# Patient Record
Sex: Female | Born: 1965 | Race: White | Hispanic: No | Marital: Married | Smoking: Former smoker
Health system: Southern US, Community
[De-identification: ages and names within clinical notes are randomized; demographics above are authoritative.]

## PROBLEM LIST (undated history)

## (undated) DIAGNOSIS — F329 Major depressive disorder, single episode, unspecified: Secondary | ICD-10-CM

## (undated) DIAGNOSIS — E119 Type 2 diabetes mellitus without complications: Secondary | ICD-10-CM

## (undated) DIAGNOSIS — C50319 Malignant neoplasm of lower-inner quadrant of unspecified female breast: Secondary | ICD-10-CM

## (undated) DIAGNOSIS — F32A Depression, unspecified: Secondary | ICD-10-CM

## (undated) DIAGNOSIS — G2581 Restless legs syndrome: Secondary | ICD-10-CM

## (undated) DIAGNOSIS — E669 Obesity, unspecified: Secondary | ICD-10-CM

## (undated) DIAGNOSIS — I10 Essential (primary) hypertension: Secondary | ICD-10-CM

## (undated) DIAGNOSIS — C50919 Malignant neoplasm of unspecified site of unspecified female breast: Secondary | ICD-10-CM

## (undated) DIAGNOSIS — C801 Malignant (primary) neoplasm, unspecified: Secondary | ICD-10-CM

## (undated) DIAGNOSIS — N926 Irregular menstruation, unspecified: Secondary | ICD-10-CM

## (undated) DIAGNOSIS — Z87891 Personal history of nicotine dependence: Secondary | ICD-10-CM

## (undated) DIAGNOSIS — N649 Disorder of breast, unspecified: Secondary | ICD-10-CM

## (undated) DIAGNOSIS — N951 Menopausal and female climacteric states: Secondary | ICD-10-CM

## (undated) DIAGNOSIS — B009 Herpesviral infection, unspecified: Secondary | ICD-10-CM

## (undated) DIAGNOSIS — Z1379 Encounter for other screening for genetic and chromosomal anomalies: Secondary | ICD-10-CM

## (undated) DIAGNOSIS — E785 Hyperlipidemia, unspecified: Secondary | ICD-10-CM

## (undated) HISTORY — DX: Personal history of nicotine dependence: Z87.891

## (undated) HISTORY — DX: Major depressive disorder, single episode, unspecified: F32.9

## (undated) HISTORY — DX: Encounter for other screening for genetic and chromosomal anomalies: Z13.79

## (undated) HISTORY — DX: Irregular menstruation, unspecified: N92.6

## (undated) HISTORY — DX: Obesity, unspecified: E66.9

## (undated) HISTORY — DX: Herpesviral infection, unspecified: B00.9

## (undated) HISTORY — DX: Essential (primary) hypertension: I10

## (undated) HISTORY — DX: Hyperlipidemia, unspecified: E78.5

## (undated) HISTORY — PX: FOOT SURGERY: SHX648

## (undated) HISTORY — DX: Depression, unspecified: F32.A

## (undated) HISTORY — DX: Disorder of breast, unspecified: N64.9

## (undated) HISTORY — DX: Menopausal and female climacteric states: N95.1

## (undated) HISTORY — PX: ENDOMETRIAL ABLATION: SHX621

## (undated) HISTORY — DX: Malignant neoplasm of lower-inner quadrant of unspecified female breast: C50.319

## (undated) HISTORY — DX: Malignant (primary) neoplasm, unspecified: C80.1

---

## 1990-05-30 DIAGNOSIS — C801 Malignant (primary) neoplasm, unspecified: Secondary | ICD-10-CM

## 1990-05-30 HISTORY — DX: Malignant (primary) neoplasm, unspecified: C80.1

## 2001-05-30 HISTORY — PX: BREAST BIOPSY: SHX20

## 2004-05-30 HISTORY — PX: TONSILLECTOMY: SUR1361

## 2004-06-10 ENCOUNTER — Ambulatory Visit: Payer: Self-pay | Admitting: Otolaryngology

## 2004-07-07 ENCOUNTER — Ambulatory Visit: Payer: Self-pay | Admitting: Otolaryngology

## 2004-07-09 ENCOUNTER — Observation Stay: Payer: Self-pay | Admitting: Otolaryngology

## 2007-05-31 DIAGNOSIS — C50919 Malignant neoplasm of unspecified site of unspecified female breast: Secondary | ICD-10-CM

## 2007-05-31 DIAGNOSIS — I10 Essential (primary) hypertension: Secondary | ICD-10-CM

## 2007-05-31 HISTORY — DX: Malignant neoplasm of unspecified site of unspecified female breast: C50.919

## 2007-05-31 HISTORY — DX: Essential (primary) hypertension: I10

## 2007-05-31 HISTORY — PX: BREAST LUMPECTOMY: SHX2

## 2007-05-31 HISTORY — PX: BREAST SURGERY: SHX581

## 2007-08-10 ENCOUNTER — Other Ambulatory Visit: Payer: Self-pay

## 2007-08-10 ENCOUNTER — Ambulatory Visit: Payer: Self-pay | Admitting: General Surgery

## 2007-08-14 ENCOUNTER — Ambulatory Visit: Payer: Self-pay | Admitting: General Surgery

## 2007-08-29 ENCOUNTER — Ambulatory Visit: Payer: Self-pay | Admitting: Radiation Oncology

## 2007-09-28 ENCOUNTER — Ambulatory Visit: Payer: Self-pay | Admitting: Radiation Oncology

## 2007-10-29 ENCOUNTER — Ambulatory Visit: Payer: Self-pay | Admitting: Radiation Oncology

## 2007-11-28 ENCOUNTER — Ambulatory Visit: Payer: Self-pay | Admitting: Radiation Oncology

## 2007-12-29 ENCOUNTER — Ambulatory Visit: Payer: Self-pay | Admitting: Radiation Oncology

## 2008-01-29 ENCOUNTER — Ambulatory Visit: Payer: Self-pay | Admitting: Radiation Oncology

## 2008-02-06 ENCOUNTER — Ambulatory Visit: Payer: Self-pay | Admitting: General Surgery

## 2008-08-01 ENCOUNTER — Ambulatory Visit: Payer: Self-pay | Admitting: General Surgery

## 2009-03-03 ENCOUNTER — Ambulatory Visit: Payer: Self-pay | Admitting: General Surgery

## 2009-05-30 ENCOUNTER — Ambulatory Visit: Payer: Self-pay | Admitting: Oncology

## 2009-07-01 ENCOUNTER — Ambulatory Visit: Payer: Self-pay | Admitting: Oncology

## 2009-07-28 ENCOUNTER — Ambulatory Visit: Payer: Self-pay | Admitting: Oncology

## 2009-08-05 ENCOUNTER — Ambulatory Visit: Payer: Self-pay

## 2009-08-10 ENCOUNTER — Ambulatory Visit: Payer: Self-pay | Admitting: Oncology

## 2009-08-28 ENCOUNTER — Ambulatory Visit: Payer: Self-pay | Admitting: Oncology

## 2010-03-10 ENCOUNTER — Ambulatory Visit: Payer: Self-pay | Admitting: General Surgery

## 2011-03-15 ENCOUNTER — Ambulatory Visit: Payer: Self-pay | Admitting: General Surgery

## 2011-05-31 DIAGNOSIS — E669 Obesity, unspecified: Secondary | ICD-10-CM

## 2011-05-31 HISTORY — DX: Obesity, unspecified: E66.9

## 2012-03-15 ENCOUNTER — Ambulatory Visit: Payer: Self-pay | Admitting: General Surgery

## 2012-10-26 ENCOUNTER — Encounter: Payer: Self-pay | Admitting: *Deleted

## 2012-10-26 DIAGNOSIS — C801 Malignant (primary) neoplasm, unspecified: Secondary | ICD-10-CM | POA: Insufficient documentation

## 2013-03-18 ENCOUNTER — Encounter: Payer: Self-pay | Admitting: General Surgery

## 2013-03-18 ENCOUNTER — Ambulatory Visit: Payer: Self-pay | Admitting: General Surgery

## 2013-03-26 ENCOUNTER — Ambulatory Visit: Payer: Self-pay | Admitting: General Surgery

## 2013-04-15 ENCOUNTER — Ambulatory Visit (INDEPENDENT_AMBULATORY_CARE_PROVIDER_SITE_OTHER): Payer: 59 | Admitting: General Surgery

## 2013-04-15 ENCOUNTER — Encounter: Payer: Self-pay | Admitting: General Surgery

## 2013-04-15 VITALS — BP 130/72 | HR 76 | Resp 14 | Ht 64.0 in | Wt 258.0 lb

## 2013-04-15 DIAGNOSIS — Z853 Personal history of malignant neoplasm of breast: Secondary | ICD-10-CM | POA: Insufficient documentation

## 2013-04-15 DIAGNOSIS — Z1239 Encounter for other screening for malignant neoplasm of breast: Secondary | ICD-10-CM | POA: Insufficient documentation

## 2013-04-15 NOTE — Patient Instructions (Signed)
Patient to return in 1 year with bilateral diagnostic mammogram. Patient to contact our office with any new questions or concerns.

## 2013-04-15 NOTE — Progress Notes (Signed)
Patient ID: Deborah Barajas, female   DOB: 26-Jun-1965, 47 y.o.   MRN: [id number]  Chief Complaint  Patient presents with  . Follow-up    1 year bilateral diagnostic mammogram     HPI Deborah Barajas is a 47 y.o. female who presents for a breast evaluation. The most recent mammogram was done on 03/18/13. Patient does perform regular self breast checks and gets regular mammograms done.The patient denies any new problems with the breasts at this time.   The patient reports that genetic testing was not covered by her insurance company.  HPI  Past Medical History  Diagnosis Date  . Herpes simplex without mention of complication 2012  . Hypertension 2009  . Personal history of tobacco use, presenting hazards to health   . Sleep apnea 2006  . Cancer 2009    left breast, diagnosed in March of 2009. 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 This was histolic grade 1, sn was negative.Hormone receptor status shows strong ER/PR. HER-2/neu:1+ Pt has completed radiation therapy in 2009  . 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 Cancer 1992  . Hyperlipidemia   . Obesity, unspecified 2013  . Depression     Past Surgical History  Procedure Laterality Date  . Breast surgery Left 2009    wide excision with sn biopsy  . Breast biopsy  2003  . Tonsillectomy  2006  . Cesarean section    . Foot surgery      Family History  Problem Relation Age of Onset  . Cancer Other     breast cancer  . Cancer Paternal Aunt     breast     Social History History  Substance Use Topics  . Smoking status: Former Smoker -- 10 years    Types: Cigarettes  . Smokeless tobacco: Not on file     Comment: quit in 1994  . Alcohol Use: No    Allergies  Allergen Reactions  . Penicillins Itching    Current Outpatient Prescriptions  Medication Sig Dispense Refill  . atorvastatin (LIPITOR) 40 MG tablet Take 1 tablet by mouth daily.      .  citalopram (CELEXA) 20 MG tablet Take 1 tablet by mouth daily.      . hydrochlorothiazide (HYDRODIURIL) 25 MG tablet Take 1 tablet by mouth daily.      Marland Kitchen lisinopril (PRINIVIL,ZESTRIL) 20 MG tablet Take 1 tablet by mouth daily.      Marland Kitchen venlafaxine XR (EFFEXOR-XR) 37.5 MG 24 hr capsule Take 1 capsule by mouth daily.       No current facility-administered medications for this visit.    Review of Systems Review of Systems  Constitutional: Negative.   Respiratory: Negative.   Cardiovascular: Negative.     Blood pressure 130/72, pulse 76, resp. rate 14, height 5\' 4"  (1.626 m), weight 258 lb (117.028 kg), last menstrual period 03/16/2013.  Physical Exam Physical Exam  Nursing note reviewed. Constitutional: She is oriented to person, place, and time. She appears well-developed and well-nourished.  Neck: Neck supple. No thyromegaly present.  Cardiovascular: Normal rate, regular rhythm and normal heart sounds.   No murmur heard. Pulmonary/Chest: Effort normal and breath sounds normal. Right breast exhibits no inverted nipple, no mass, no nipple discharge, no skin change and no tenderness. Left breast exhibits no inverted nipple, no mass, no nipple discharge, no skin change and no tenderness.  Thickening at the wide excision site of left breast.  Well healed  scar in the left axillary.   Lymphadenopathy:    She has no cervical adenopathy.    She has no axillary adenopathy.  Neurological: She is alert and oriented to person, place, and time.  Skin: Skin is warm and dry.    Data Reviewed Bilateral mammograms dated January 16, 2013 were reviewed. No interval change.  BI-RAD-2.  Assessment    Benign breast exam. Now 5.5 years after conservative treatment.     Plan    The patient elected not to complete 5 years of antiestrogen therapy.  Gen. Low-grade tumor, T1b, no negative.  A prescription for a left breast insert and surgical bra was provided due to the asymmetry present.  We'll  plan for follow up examination with repeat bilateral diagnostic mammograms in one year.        Deborah Barajas 04/15/2013, 8:50 PM

## 2013-04-18 ENCOUNTER — Telehealth: Payer: Self-pay | Admitting: General Surgery

## 2013-04-18 NOTE — Telephone Encounter (Signed)
04-18-13 @ 12:48PM I CALLED PT.NEED PCP INFO/[facility]

## 2013-05-07 ENCOUNTER — Encounter: Payer: Self-pay | Admitting: General Surgery

## 2014-03-05 ENCOUNTER — Other Ambulatory Visit: Payer: 59 | Admitting: Advanced Practice Midwife

## 2014-03-07 ENCOUNTER — Other Ambulatory Visit: Payer: 59 | Admitting: Obstetrics & Gynecology

## 2014-03-12 ENCOUNTER — Other Ambulatory Visit (HOSPITAL_COMMUNITY)
Admission: RE | Admit: 2014-03-12 | Discharge: 2014-03-12 | Disposition: A | Discharge status: Home / Self Care | Payer: 59 | Source: Ambulatory Visit | Attending: Adult Health | Admitting: Adult Health

## 2014-03-12 ENCOUNTER — Encounter: Payer: Self-pay | Admitting: Adult Health

## 2014-03-12 ENCOUNTER — Ambulatory Visit (INDEPENDENT_AMBULATORY_CARE_PROVIDER_SITE_OTHER): Payer: 59 | Admitting: Adult Health

## 2014-03-12 ENCOUNTER — Other Ambulatory Visit: Payer: Self-pay | Admitting: Adult Health

## 2014-03-12 VITALS — BP 132/72 | HR 80 | Ht 64.0 in | Wt 262.5 lb

## 2014-03-12 DIAGNOSIS — Z853 Personal history of malignant neoplasm of breast: Secondary | ICD-10-CM

## 2014-03-12 DIAGNOSIS — Z01419 Encounter for gynecological examination (general) (routine) without abnormal findings: Secondary | ICD-10-CM

## 2014-03-12 DIAGNOSIS — I10 Essential (primary) hypertension: Secondary | ICD-10-CM

## 2014-03-12 DIAGNOSIS — N926 Irregular menstruation, unspecified: Secondary | ICD-10-CM

## 2014-03-12 DIAGNOSIS — Z1212 Encounter for screening for malignant neoplasm of rectum: Secondary | ICD-10-CM

## 2014-03-12 DIAGNOSIS — Z1151 Encounter for screening for human papillomavirus (HPV): Secondary | ICD-10-CM | POA: Diagnosis present

## 2014-03-12 DIAGNOSIS — N951 Menopausal and female climacteric states: Secondary | ICD-10-CM | POA: Insufficient documentation

## 2014-03-12 HISTORY — DX: Irregular menstruation, unspecified: N92.6

## 2014-03-12 HISTORY — DX: Menopausal and female climacteric states: N95.1

## 2014-03-12 LAB — LIPID PANEL
CHOL/HDL RATIO: 6.4 ratio
Cholesterol: 249 mg/dL — ABNORMAL HIGH (ref 0–200)
HDL: 39 mg/dL — AB (ref >39)
LDL CALC: 154 mg/dL — AB (ref 0–99)
TRIGLYCERIDES: 278 mg/dL — AB (ref <150)
VLDL: 56 mg/dL — AB (ref 0–40)

## 2014-03-12 LAB — COMPREHENSIVE METABOLIC PANEL
ALK PHOS: 49 U/L (ref 39–117)
ALT: 14 U/L (ref 0–35)
AST: 16 U/L (ref 0–37)
Albumin: 3.6 g/dL (ref 3.5–5.2)
BUN: 10 mg/dL (ref 6–23)
CALCIUM: 8.6 mg/dL (ref 8.4–10.5)
CHLORIDE: 107 meq/L (ref 96–112)
CO2: 25 mEq/L (ref 19–32)
CREATININE: 0.65 mg/dL (ref 0.50–1.10)
Glucose, Bld: 120 mg/dL — ABNORMAL HIGH (ref 70–99)
Potassium: 4.2 mEq/L (ref 3.5–5.3)
Sodium: 141 mEq/L (ref 135–145)
Total Bilirubin: 0.4 mg/dL (ref 0.2–1.2)
Total Protein: 6 g/dL (ref 6.0–8.3)

## 2014-03-12 LAB — CBC
HEMATOCRIT: 37 % (ref 36.0–46.0)
Hemoglobin: 12.3 g/dL (ref 12.0–15.0)
MCH: 30.8 pg (ref 26.0–34.0)
MCHC: 33.2 g/dL (ref 30.0–36.0)
MCV: 92.5 fL (ref 78.0–100.0)
PLATELETS: 326 10*3/uL (ref 150–400)
RBC: 4 MIL/uL (ref 3.87–5.11)
RDW: 13.6 % (ref 11.5–15.5)
WBC: 7 10*3/uL (ref 4.0–10.5)

## 2014-03-12 LAB — HEMOCCULT GUIAC POC 1CARD (OFFICE): Fecal Occult Blood, POC: NEGATIVE

## 2014-03-12 LAB — TSH: TSH: 1.973 u[IU]/mL (ref 0.350–4.500)

## 2014-03-12 MED ORDER — NYSTATIN 100000 UNIT/GM EX POWD: CUTANEOUS | Status: DC

## 2014-03-12 NOTE — Progress Notes (Signed)
Patient ID: Deborah Barajas, female   DOB: 04-26-1966, 48 y.o.   MRN: [id number] History of Present Illness: Deborah Barajas is a 48 year old white female,married, new to this practice,in for a pap and physical.She complains of irregular periods with clots, some hot feelings and vaginal itch.She is sp breast cancer that was ER+.   Current Medications, Allergies, Past Medical History, Past Surgical History, Family History and Social History were reviewed in [employer] record.     Review of Systems: Patient denies any  blurred vision, shortness of breath, chest pain, abdominal pain, problems with bowel movements, urination, or intercourse. No joint pain or swelling, is stiff after sitting.She is moody but takes celexa and Effexor.She has what sounds like menstrual headaches,see HPI.    Physical Exam:BP 132/72  Pulse 80  Ht 5\' 4"  (1.626 m)  Wt 262 lb 8 oz (119.069 kg)  BMI 45.04 kg/m2  LMP 03/03/2014 General:  Well developed, well nourished, no acute distress Skin:  Warm and dry Neck:  Midline trachea, normal thyroid Lungs; Clear to auscultation bilaterally Breast:  No dominant palpable mass, retraction, or nipple discharge, has scarring left breast where had lumpectomy and radiation at 9-10 o'clock it is thick and firm Cardiovascular: Regular rate and rhythm Abdomen:  Soft, non tender, no hepatosplenomegaly,obese Pelvic:  External genitalia is normal in appearance.Skin fungus in groin.  The vagina is normal in appearance.  The cervix is bulbous. Pap with HPV performed. Uterus is felt to be normal size, shape, and contour.  No   adnexal masses or tenderness noted. Rectal: Good sphincter tone, no polyps, or hemorrhoids felt.  Hemoccult negative. Extremities:  No swelling or varicosities noted Psych:  No mood changes,alert and cooperative,seems happy   Impression: Well woman gyn exam Irregular periods Peri menopausal Hypertension History of breast cancer Skin  fungus     Plan: Rx nystatin powder to use 2-3 x daily prn Return in 2 weeks for [place] and see me Request records from [place]  Physical in 1 year  Mammogram in November Colonoscopy at 77  Check CBC,CMP,TSH and lipids  Review handout on perimenopause

## 2014-03-12 NOTE — Patient Instructions (Signed)
Return in 2 weeks for [place] and see me  Get mammogram yearly  Colonoscopy at 70 Perimenopause Perimenopause is the time when your body begins to move into the menopause (no menstrual period for 12 straight months). It is a natural process. Perimenopause can begin 2-8 years before the menopause and usually lasts for 1 year after the menopause. During this time, your ovaries may or may not produce an egg. The ovaries vary in their production of estrogen and progesterone hormones each month. This can cause irregular menstrual periods, difficulty getting pregnant, vaginal bleeding between periods, and uncomfortable symptoms. CAUSES  Irregular production of the ovarian hormones, estrogen and progesterone, and not ovulating every month.  Other causes include:  Tumor of the pituitary gland in the brain.  Medical disease that affects the ovaries.  Radiation treatment.  Chemotherapy.  Unknown causes.  Heavy smoking and excessive alcohol intake can bring on perimenopause sooner. SIGNS AND SYMPTOMS   Hot flashes.  Night sweats.  Irregular menstrual periods.  Decreased sex drive.  Vaginal dryness.  Headaches.  Mood swings.  Depression.  Memory problems.  Irritability.  Tiredness.  Weight gain.  Trouble getting pregnant.  The beginning of losing bone cells (osteoporosis).  The beginning of hardening of the arteries (atherosclerosis). DIAGNOSIS  Your health care provider will make a diagnosis by analyzing your age, menstrual history, and symptoms. He or she will do a physical exam and note any changes in your body, especially your female organs. Female hormone tests may or may not be helpful depending on the amount of female hormones you produce and when you produce them. However, other hormone tests may be helpful to rule out other problems. TREATMENT  In some cases, no treatment is needed. The decision on whether treatment is necessary during the perimenopause should be made  by you and your health care provider based on how the symptoms are affecting you and your lifestyle. Various treatments are available, such as:  Treating individual symptoms with a specific medicine for that symptom.  Herbal medicines that can help specific symptoms.  Counseling.  Group therapy. HOME CARE INSTRUCTIONS   Keep track of your menstrual periods (when they occur, how heavy they are, how long between periods, and how long they last) as well as your symptoms and when they started.  Only take over-the-counter or prescription medicines as directed by your health care provider.  Sleep and rest.  Exercise.  Eat a diet that contains calcium (good for your bones) and soy (acts like the estrogen hormone).  Do not smoke.  Avoid alcoholic beverages.  Take vitamin supplements as recommended by your health care provider. Taking vitamin E may help in certain cases.  Take calcium and vitamin D supplements to help prevent bone loss.  Group therapy is sometimes helpful.  Acupuncture may help in some cases. SEEK MEDICAL CARE IF:   You have questions about any symptoms you are having.  You need a referral to a specialist (gynecologist, psychiatrist, or psychologist). SEEK IMMEDIATE MEDICAL CARE IF:   You have vaginal bleeding.  Your period lasts longer than 8 days.  Your periods are recurring sooner than 21 days.  You have bleeding after intercourse.  You have severe depression.  You have pain when you urinate.  You have severe headaches.  You have vision problems. Document Released: 06/23/2004 Document Revised: 03/06/2013 Document Reviewed: 12/13/2012 [facility] Patient Information 2015 [place], [place]. This information is not intended to replace advice given to you by your health care provider. Make  sure you discuss any questions you have with your health care provider.  

## 2014-03-13 LAB — HEMOGLOBIN A1C
HEMOGLOBIN A1C: 6.2 % — AB (ref <5.7)
Mean Plasma Glucose: 131 mg/dL — ABNORMAL HIGH (ref <117)

## 2014-03-13 LAB — CYTOLOGY - PAP

## 2014-03-14 ENCOUNTER — Telehealth: Payer: Self-pay | Admitting: Adult Health

## 2014-03-14 NOTE — Telephone Encounter (Signed)
Eft message to call about labs monday

## 2014-03-17 ENCOUNTER — Telehealth: Payer: Self-pay | Admitting: Adult Health

## 2014-03-17 NOTE — Telephone Encounter (Signed)
Pt aware A1c 6.2 at risk of developing diabetes,try diet modifications(decrease carbs) and increase exercise, may consider metformin and needs to get back on lipitor cholesterol and triglycerides high, will send copy to [facility] and they can order labs in 3 months and manage

## 2014-03-26 ENCOUNTER — Ambulatory Visit (INDEPENDENT_AMBULATORY_CARE_PROVIDER_SITE_OTHER): Payer: 59 | Admitting: Adult Health

## 2014-03-26 ENCOUNTER — Encounter: Payer: Self-pay | Admitting: Adult Health

## 2014-03-26 ENCOUNTER — Ambulatory Visit (INDEPENDENT_AMBULATORY_CARE_PROVIDER_SITE_OTHER): Payer: 59

## 2014-03-26 ENCOUNTER — Other Ambulatory Visit: Payer: Self-pay | Admitting: Adult Health

## 2014-03-26 VITALS — BP 136/80 | Ht 64.0 in | Wt 261.0 lb

## 2014-03-26 DIAGNOSIS — N926 Irregular menstruation, unspecified: Secondary | ICD-10-CM

## 2014-03-26 DIAGNOSIS — Z853 Personal history of malignant neoplasm of breast: Secondary | ICD-10-CM

## 2014-03-26 DIAGNOSIS — N951 Menopausal and female climacteric states: Secondary | ICD-10-CM

## 2014-03-26 MED ORDER — IBUPROFEN 800 MG PO TABS: 800.0000 mg | ORAL_TABLET | Freq: Three times a day (TID) | ORAL | Status: DC | PRN

## 2014-03-26 NOTE — Patient Instructions (Signed)
Talk with breast doctor about if progesterone + or not Review handout on ablation and IUD

## 2014-03-26 NOTE — Progress Notes (Signed)
Subjective:     Patient ID: Deborah Barajas, female   DOB: 04/07/1966, 48 y.o.   MRN: [id number]  HPI Rhythm is a 47 year old white female in for [place] for irregular periods with clots at times.  Review of Systems See HPI Reviewed past medical,surgical, social and family history. Reviewed medications and allergies.     Objective:   Physical Exam BP 136/80  Ht 5\' 4"  (1.626 m)  Wt 261 lb (118.389 kg)  BMI 44.78 kg/m2  LMP 03/03/2014,Reviewed [place] with pt.   Uterus 11.5 x 7.6 x 6.0 cm, anteverted  Endometrium 13.9 mm, symmetrical,  Right ovary 2.9 x 2.2 x 1.9 cm,  Left ovary 3.4 x 2.5 x 2.2 cm,  No free fluid or adnexal masses noted  Technician Comments:  Anteverted uterus, Endom-13.77mm symmetrical, bilateral adnexa/ovaries appear WNL, no free fluid or adnexal masses noted within the pelvis, Pt noted increased with pressure applied to LT adnexa  Has headache with this period and tired motrin 800 mg and it helped.disucssed endo ablation or mirena IUD to control periods til PM,but needs to talk with Breast doctor to see if cancer was progesterone +, she knows was ER+ and if he thinks her ovaries need to be removed.She know she can not take HRT.She has appt 11/2 with [facility](they were sent labs).   Assessment:     Irregular periods with clots Peri menopausal History of breast cancer    Plan:     Talk with breast doctor, review handouts on ablation and IUD Follow up prn  Keep appt with [facility]   Rx motrin 800 mg #60 1 every 8 hours prn with 3 refills

## 2014-03-31 ENCOUNTER — Encounter: Payer: Self-pay | Admitting: Adult Health

## 2014-04-08 ENCOUNTER — Encounter: Payer: Self-pay | Admitting: General Surgery

## 2014-04-16 ENCOUNTER — Ambulatory Visit (INDEPENDENT_AMBULATORY_CARE_PROVIDER_SITE_OTHER): Payer: 59 | Admitting: General Surgery

## 2014-04-16 ENCOUNTER — Encounter: Payer: Self-pay | Admitting: General Surgery

## 2014-04-16 VITALS — BP 124/70 | HR 74 | Resp 14 | Ht 64.0 in | Wt 258.0 lb

## 2014-04-16 DIAGNOSIS — Z853 Personal history of malignant neoplasm of breast: Secondary | ICD-10-CM

## 2014-04-16 NOTE — Patient Instructions (Signed)
The patient has been asked to return to the office in one year with a bilateral diagnostic mammogram. 

## 2014-04-16 NOTE — Progress Notes (Signed)
Patient ID: Deborah Barajas, female   DOB: [birth year], 48 y.o.   MRN: [id number]  Chief Complaint  Patient presents with  . Follow-up    mammogram    HPI Deborah Barajas is a 48 y.o. female. who presents for a breast evaluation. The most recent mammogram was done on 04/04/14 .  Patient does perform regular self breast checks and gets regular mammograms done.    HPI  Past Medical History  Diagnosis Date  . Hypertension 2009  . Personal history of tobacco use, presenting hazards to health   . Sleep apnea 2006  . Hyperlipidemia   . Obesity, unspecified 2013  . Depression   . Irregular periods 03/12/2014  . Peri-menopausal 03/12/2014  . Herpes simplex without mention of complication   . Cancer 2009    Left breast, 0.6 cm, diagnosed in March of 2009. s/p lumpectomy with sn biopsy 11m infiltrating ductal carcinoma with widely clear margins. This was histolic grade 1, sn was negative.Hormone receptor status shows strong ER/PR. HER-2/neu: Not amplified on fish. Pt has completed radiation therapy in 2009  . 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 Cancer 1992    Past Surgical History  Procedure Laterality Date  . Breast surgery Left 2009    wide excision with sn biopsy  . Breast biopsy  2003  . Tonsillectomy  2006  . Cesarean section    . Foot surgery      Family History  Problem Relation Age of Onset  . Cancer Other     maternal great aunt-breast  . Cancer Paternal Aunt     breast   . Hypertension Mother   . Thyroid disease Mother   . Hypertension Father   . Diabetes Brother   . Cancer Maternal Aunt     lung  . Cancer Maternal Uncle     leukemia  . Hypertension Maternal Grandmother   . Cancer Maternal Grandfather     lung  . COPD Paternal Grandmother   . Heart attack Paternal Grandfather   . Thyroid disease Brother   . Heart attack Maternal Uncle   . Cancer Maternal Uncle     pancreatic  . Cancer Maternal Aunt     pancreatic  . Diabetes  Paternal Aunt     Social History History  Substance Use Topics  . Smoking status: Former Smoker -- 10 years    Types: Cigarettes  . Smokeless tobacco: Never Used     Comment: quit in 1994  . Alcohol Use: No    Allergies  Allergen Reactions  . Penicillins Itching    Current Outpatient Prescriptions  Medication Sig Dispense Refill  . atorvastatin (LIPITOR) 40 MG tablet Take 1 tablet by mouth daily.    . citalopram (CELEXA) 40 MG tablet Take 40 mg by mouth daily.    . hydrochlorothiazide (HYDRODIURIL) 25 MG tablet Take 1 tablet by mouth daily.    .Marland Kitchenibuprofen (ADVIL,MOTRIN) 800 MG tablet Take 1 tablet (800 mg total) by mouth every 8 (eight) hours as needed. 60 tablet 3  . lisinopril (PRINIVIL,ZESTRIL) 20 MG tablet Take 1 tablet by mouth daily.    . metroNIDAZOLE (METROCREAM) 0.75 % cream     . nystatin (MYCOSTATIN/NYSTOP) 100000 UNIT/GM POWD Use 2-3 times daily prn to affected area 30 g 3  . venlafaxine (EFFEXOR) 75 MG tablet Take 75 mg by mouth daily.     No current facility-administered medications for this visit.    Review of Systems Review  of Systems  Constitutional: Negative.   Respiratory: Negative.   Cardiovascular: Negative.     Blood pressure 124/70, pulse 74, resp. rate 14, height _0  (1.626 m), weight 258 lb (117.028 kg).  Physical Exam Physical Exam  Constitutional: She is oriented to person, place, and time. She appears well-developed and well-nourished.  Eyes: Conjunctivae are normal. No scleral icterus.  Neck: Neck supple.  Cardiovascular: Normal rate, regular rhythm and normal heart sounds.   Pulmonary/Chest: Effort normal and breath sounds normal. Right breast exhibits no inverted nipple, no mass, no nipple discharge, no skin change and no tenderness. Left breast exhibits no inverted nipple, no mass, no nipple discharge, no skin change and no tenderness. Breasts are asymmetrical ( left breast small than right).    Left breast thickening at 9 o'clock .  Well healed axillary incision .   Lymphadenopathy:    She has no cervical adenopathy.    She has no axillary adenopathy.  Neurological: She is alert and oriented to person, place, and time.  Skin: Skin is warm and dry.    Data Reviewed Bilateral mammograms dated November 6, 10 completed at [facility] were reviewed. Architectural changes with previous surgery and radiation noted. BI-RADS-2.  Assessment    Betadine exam now 6-1/2 years after treatment of a stage I carcinoma the left breast.    Plan    The patient has been asked to return to the office in one year with a bilateral diagnostic mammogram.      PCP:  Gibson Ramp 04/16/2014, 8:08 PM

## 2014-11-24 ENCOUNTER — Ambulatory Visit: Payer: 59 | Admitting: Anesthesiology

## 2014-11-24 ENCOUNTER — Encounter: Payer: Self-pay | Admitting: Anesthesiology

## 2014-11-24 ENCOUNTER — Ambulatory Visit
Admission: RE | Admit: 2014-11-24 | Discharge: 2014-11-24 | Disposition: A | Discharge status: Home / Self Care | Payer: 59 | Source: Ambulatory Visit | Attending: Gastroenterology | Admitting: Gastroenterology

## 2014-11-24 ENCOUNTER — Encounter
Admission: RE | Disposition: A | Discharge status: Home / Self Care | Payer: Self-pay | Source: Ambulatory Visit | Attending: Gastroenterology

## 2014-11-24 DIAGNOSIS — Z6841 Body Mass Index (BMI) 40.0 and over, adult: Secondary | ICD-10-CM | POA: Insufficient documentation

## 2014-11-24 DIAGNOSIS — Q438 Other specified congenital malformations of intestine: Secondary | ICD-10-CM | POA: Diagnosis not present

## 2014-11-24 DIAGNOSIS — Z87891 Personal history of nicotine dependence: Secondary | ICD-10-CM | POA: Diagnosis not present

## 2014-11-24 DIAGNOSIS — I10 Essential (primary) hypertension: Secondary | ICD-10-CM | POA: Diagnosis not present

## 2014-11-24 DIAGNOSIS — G4730 Sleep apnea, unspecified: Secondary | ICD-10-CM | POA: Diagnosis not present

## 2014-11-24 DIAGNOSIS — E669 Obesity, unspecified: Secondary | ICD-10-CM | POA: Insufficient documentation

## 2014-11-24 DIAGNOSIS — F329 Major depressive disorder, single episode, unspecified: Secondary | ICD-10-CM | POA: Diagnosis not present

## 2014-11-24 DIAGNOSIS — Z8371 Family history of colonic polyps: Secondary | ICD-10-CM | POA: Diagnosis not present

## 2014-11-24 DIAGNOSIS — Z1211 Encounter for screening for malignant neoplasm of colon: Secondary | ICD-10-CM | POA: Insufficient documentation

## 2014-11-24 DIAGNOSIS — Z79899 Other long term (current) drug therapy: Secondary | ICD-10-CM | POA: Diagnosis not present

## 2014-11-24 DIAGNOSIS — E785 Hyperlipidemia, unspecified: Secondary | ICD-10-CM | POA: Insufficient documentation

## 2014-11-24 DIAGNOSIS — Z853 Personal history of malignant neoplasm of breast: Secondary | ICD-10-CM | POA: Diagnosis not present

## 2014-11-24 HISTORY — PX: COLONOSCOPY WITH PROPOFOL: SHX5780

## 2014-11-24 LAB — POCT PREGNANCY, URINE: PREG TEST UR: NEGATIVE

## 2014-11-24 SURGERY — COLONOSCOPY WITH PROPOFOL
Anesthesia: General

## 2014-11-24 MED ORDER — SODIUM CHLORIDE 0.9 % IV SOLN
INTRAVENOUS | Status: DC
  Administered 2014-11-24: 1000 mL via INTRAVENOUS

## 2014-11-24 MED ORDER — PROPOFOL INFUSION 10 MG/ML OPTIME
INTRAVENOUS | Status: DC | PRN
  Administered 2014-11-24: 100 ug/kg/min via INTRAVENOUS

## 2014-11-24 MED ORDER — SODIUM CHLORIDE 0.9 % IV SOLN: INTRAVENOUS | Status: DC

## 2014-11-24 MED ORDER — FENTANYL CITRATE (PF) 100 MCG/2ML IJ SOLN
INTRAMUSCULAR | Status: DC | PRN
  Administered 2014-11-24: 50 ug via INTRAVENOUS

## 2014-11-24 MED ORDER — MIDAZOLAM HCL 2 MG/2ML IJ SOLN
INTRAMUSCULAR | Status: DC | PRN
  Administered 2014-11-24: 1 mg via INTRAVENOUS

## 2014-11-24 NOTE — Discharge Instructions (Signed)

## 2014-11-24 NOTE — Op Note (Signed)
[facility] Patient Name: Deborah Barajas Procedure Date: 11/24/2014 11:07 AM MRN: [id number] Account #: [id number] Date of Birth: 11-Sep-1965 Admit Type: Outpatient Age: 49 Room: [facility] ENDO ROOM 3 Gender: Female Note Status: Finalized Procedure:         Colonoscopy Indications:       Colon cancer screening in patient at increased risk:                     Family history of colon polyps, This is the patient's                     first colonoscopy Patient Profile:   This is a 49 year old female. Providers:         Gerrit Heck. Rayann Heman, MD Referring MD:      Lennon Alstrom. Baucom (Referring MD) Medicines:         Propofol per Anesthesia Complications:     No immediate complications. Procedure:         Pre-Anesthesia Assessment:                    - Prior to the procedure, a History and Physical was                     performed, and patient medications, allergies and                     sensitivities were reviewed. The patient's tolerance of                     previous anesthesia was reviewed.                    After obtaining informed consent, the colonoscope was                     passed under direct vision. Throughout the procedure, the                     patient's blood pressure, pulse, and oxygen saturations                     were monitored continuously. The Colonoscope was                     introduced through the anus and advanced to the the cecum,                     identified by appendiceal orifice and ileocecal valve. The                     colonoscopy was somewhat difficult due to a tortuous                     colon. Successful completion of the procedure was aided by                     using manual pressure. The patient tolerated the procedure                     well. The quality of the bowel preparation was excellent. Findings:      The perianal and digital rectal examinations were normal.      The entire examined colon appeared normal  on direct and retroflexion  views. Impression:        - The entire examined colon is normal on direct and                     retroflexion views.                    - No specimens collected. Recommendation:    - Observe patient in GI recovery unit.                    - Continue present medications.                    - High fiber diet.                    - Repeat colonoscopy in 5 years for screening purposes.                    - Return to referring physician.                    - The findings and recommendations were discussed with the                     patient.                    - The findings and recommendations were discussed with the                     patient's family. Procedure Code(s): --- Professional ---                    [phone], Colonoscopy, flexible; diagnostic, including                     collection of specimen(s) by brushing or washing, when                     performed (separate procedure) CPT copyright 2014 [employer]. All rights reserved. The codes documented in this report are preliminary and upon coder review may  be revised to meet current compliance requirements. Mellody Life, MD 11/24/2014 11:40:53 AM This report has been signed electronically. Number of Addenda: 0 Note Initiated On: 11/24/2014 11:07 AM Scope Withdrawal Time: 0 hours 7 minutes 5 seconds  Total Procedure Duration: 0 hours 21 minutes 46 seconds       [facility]

## 2014-11-24 NOTE — H&P (Signed)
Primary Care Physician:  Jacqulyn Bath, MD  Pre-Procedure History & Physical: HPI:  Deborah Barajas is a 49 y.o. female is here for an colonoscopy.   Past Medical History  Diagnosis Date  . Hypertension 2009  . Personal history of tobacco use, presenting hazards to health   . Sleep apnea 2006  . Hyperlipidemia   . Obesity, unspecified 2013  . Depression   . Irregular periods 03/12/2014  . Peri-menopausal 03/12/2014  . Herpes simplex without mention of complication   . Cancer 2009    Left breast, 0.6 cm, diagnosed in March of 2009. s/p lumpectomy with sn biopsy 74mm infiltrating ductal carcinoma with widely clear margins. This was histolic grade 1, sn was negative.Hormone receptor status shows strong ER/PR. HER-2/neu: Not amplified on fish. Pt has completed radiation therapy in 2009  . 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 Cancer 1992    Past Surgical History  Procedure Laterality Date  . Breast surgery Left 2009    wide excision with sn biopsy  . Breast biopsy  2003  . Tonsillectomy  2006  . Cesarean section    . Foot surgery      Prior to Admission medications   Medication Sig Start Date End Date Taking? Authorizing Provider  citalopram (CELEXA) 40 MG tablet Take 20 mg by mouth daily.    Yes Historical Provider, MD  hydrochlorothiazide (HYDRODIURIL) 25 MG tablet Take 25 mg by mouth daily.  04/01/13  Yes Historical Provider, MD  ibuprofen (ADVIL,MOTRIN) 800 MG tablet Take 1 tablet (800 mg total) by mouth every 8 (eight) hours as needed. Patient taking differently: Take 800 mg by mouth every 8 (eight) hours as needed for moderate pain.  03/26/14  Yes Estill Dooms, NP  lisinopril (PRINIVIL,ZESTRIL) 20 MG tablet Take 20 mg by mouth daily.  04/01/13  Yes Historical Provider, MD  metroNIDAZOLE (METROCREAM) 0.75 % cream Apply 1 application topically 2 (two) times daily.  04/03/14  Yes Historical Provider, MD  nystatin (MYCOSTATIN/NYSTOP)  100000 UNIT/GM POWD Use 2-3 times daily prn to affected area Patient not taking: Reported on 11/11/2014 03/12/14   Estill Dooms, NP    Allergies as of 10/23/2014 - Review Complete 04/16/2014  Allergen Reaction Noted  . Penicillins Itching 10/26/2012    Family History  Problem Relation Age of Onset  . Cancer Other     maternal great aunt-breast  . Cancer Paternal Aunt     breast   . Hypertension Mother   . Thyroid disease Mother   . Hypertension Father   . Diabetes Brother   . Cancer Maternal Aunt     lung  . Cancer Maternal Uncle     leukemia  . Hypertension Maternal Grandmother   . Cancer Maternal Grandfather     lung  . COPD Paternal Grandmother   . Heart attack Paternal Grandfather   . Thyroid disease Brother   . Heart attack Maternal Uncle   . Cancer Maternal Uncle     pancreatic  . Cancer Maternal Aunt     pancreatic  . Diabetes Paternal Aunt     History   Social History  . Marital Status: Married    Spouse Name: N/A  . Number of Children: N/A  . Years of Education: N/A   Occupational History  . Not on file.   Social History Main Topics  . Smoking status: Former Smoker -- 10 years    Types: Cigarettes  . Smokeless tobacco: Never  Used     Comment: quit in 1994  . Alcohol Use: No  . Drug Use: No  . Sexual Activity: Yes    Birth Control/ Protection: None   Other Topics Concern  . Not on file   Social History Narrative     Physical Exam: BP 151/91 mmHg  Pulse 65  Temp(Src) 98.7 F (37.1 C) (Tympanic)  Resp 16  Ht $R'5\' 4"'FO$  (1.626 m)  Wt 260 lb (117.935 kg)  BMI 44.61 kg/m2  SpO2 100% General:   Alert,  pleasant and cooperative in NAD Head:  Normocephalic and atraumatic. Neck:  Supple; no masses or thyromegaly. Lungs:  Clear throughout to auscultation.    Heart:  Regular rate and rhythm. Abdomen:  Soft, nontender and nondistended. Normal bowel sounds, without guarding, and without rebound.   Neurologic:  Alert and  oriented x4;   grossly normal neurologically.  Impression/Plan: Deborah Barajas is here for an colonoscopy to be performed for screening, fam hx colon polyps  Risks, benefits, limitations, and alternatives regarding  colonoscopy have been reviewed with the patient.  Questions have been answered.  All parties agreeable.   Josefine Class, MD  11/24/2014, 11:00 AM

## 2014-11-24 NOTE — Anesthesia Preprocedure Evaluation (Addendum)
Anesthesia Evaluation  Patient identified by MRN, date of birth, ID band Patient awake    Reviewed: Allergy & Precautions, NPO status , Patient's Chart, lab work & pertinent test results  Airway Mallampati: III       Dental  (+) Chipped   Pulmonary sleep apnea , former smoker,          Cardiovascular hypertension,     Neuro/Psych PSYCHIATRIC DISORDERS Depression    GI/Hepatic   Endo/Other    Renal/GU      Musculoskeletal   Abdominal   Peds  Hematology   Anesthesia Other Findings Obesity. Does not use  Cpap. Will keep her in pacu longer than usual. Pt. Has son who will keep an eye on her tonight.  Reproductive/Obstetrics                            Anesthesia Physical Anesthesia Plan  ASA: III  Anesthesia Plan: General   Post-op Pain Management:    Induction: Intravenous  Airway Management Planned: Nasal Cannula  Additional Equipment:   Intra-op Plan:   Post-operative Plan:   Informed Consent: I have reviewed the patients History and Physical, chart, labs and discussed the procedure including the risks, benefits and alternatives for the proposed anesthesia with the patient or authorized representative who has indicated his/her understanding and acceptance.     Plan Discussed with: CRNA  Anesthesia Plan Comments:         Anesthesia Quick Evaluation

## 2014-11-24 NOTE — Anesthesia Postprocedure Evaluation (Signed)
  Anesthesia Post-op Note  Patient: Deborah Barajas  Procedure(s) Performed: Procedure(s): COLONOSCOPY WITH PROPOFOL (N/A)  Anesthesia type:General  Patient location: PACU  Post pain: Pain level controlled  Post assessment: Post-op Vital signs reviewed, Patient's Cardiovascular Status Stable, Respiratory Function Stable, Patent Airway and No signs of Nausea or vomiting  Post vital signs: Reviewed and stable  Last Vitals:  Filed Vitals:   11/24/14 1214  BP: 160/86  Pulse: 68  Temp:   Resp: 18    Level of consciousness: awake, alert  and patient cooperative  Complications: No apparent anesthesia complications

## 2014-11-24 NOTE — Transfer of Care (Signed)
Immediate Anesthesia Transfer of Care Note  Patient: Deborah Barajas  Procedure(s) Performed: Procedure(s): COLONOSCOPY WITH PROPOFOL (N/A)  Patient Location: PACU  Anesthesia Type:General  Level of Consciousness: awake and sedated  Airway & Oxygen Therapy: Patient Spontanous Breathing and Patient connected to nasal cannula oxygen  Post-op Assessment: Report given to RN and Post -op Vital signs reviewed and stable  Post vital signs: Reviewed and stable  Last Vitals:  Filed Vitals:   11/24/14 1144  BP: 132/62  Pulse: 67  Temp: 36.2 C  Resp: 16    Complications: No apparent anesthesia complications

## 2014-11-26 ENCOUNTER — Encounter: Payer: Self-pay | Admitting: Gastroenterology

## 2015-04-20 ENCOUNTER — Encounter: Payer: Self-pay | Admitting: General Surgery

## 2015-04-20 ENCOUNTER — Ambulatory Visit (INDEPENDENT_AMBULATORY_CARE_PROVIDER_SITE_OTHER): Payer: 59 | Admitting: General Surgery

## 2015-04-20 VITALS — BP 130/78 | HR 78 | Resp 14 | Ht 64.0 in | Wt 262.6 lb

## 2015-04-20 DIAGNOSIS — Z853 Personal history of malignant neoplasm of breast: Secondary | ICD-10-CM

## 2015-04-20 NOTE — Progress Notes (Signed)
Patient ID: Deborah Barajas, female   DOB: [birth year], 49 y.o.   MRN: [id number]  Chief Complaint  Patient presents with  . Follow-up    mammogram    HPI Deborah Barajas is a 49 y.o. female who presents for a breast evaluation. The most recent mammogram was done on 04/10/15. Patient does perform regular self breast checks and gets regular mammograms done. She denies any new breast problems.    HPI  Past Medical History  Diagnosis Date  . Hypertension 2009  . Personal history of tobacco use, presenting hazards to health   . Sleep apnea 2006  . Hyperlipidemia   . Obesity, unspecified 2013  . Depression   . Irregular periods 03/12/2014  . Peri-menopausal 03/12/2014  . Herpes simplex without mention of complication   . Cancer [facility]) 2009    Left breast, 0.6 cm, diagnosed in March of 2009. s/p lumpectomy with sn biopsy 65mm infiltrating ductal carcinoma with widely clear margins. This was histolic grade 1, sn was negative.Hormone receptor status shows strong ER/PR. HER-2/neu: Not amplified on fish. Pt has completed radiation therapy in 2009  . 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 Breast surgery Left 2009    wide excision with sn biopsy  . Breast biopsy  2003  . Tonsillectomy  2006  . Cesarean section    . Foot surgery    . Colonoscopy with propofol N/A 11/24/2014    Procedure: COLONOSCOPY WITH PROPOFOL;  Surgeon: Josefine Class, MD;  Location[facility] ENDOSCOPY;  Service: Endoscopy;  Laterality: N/A;    Family History  Problem Relation Age of Onset  . Cancer Other     maternal great aunt-breast  . Cancer Paternal Aunt     breast   . Hypertension Mother   . Thyroid disease Mother   . Hypertension Father   . Diabetes Brother   . Cancer Maternal Aunt     lung  . Cancer Maternal Uncle     leukemia  . Hypertension Maternal Grandmother   . Cancer  Maternal Grandfather     lung  . COPD Paternal Grandmother   . Heart attack Paternal Grandfather   . Thyroid disease Brother   . Heart attack Maternal Uncle   . Cancer Maternal Uncle     pancreatic  . Cancer Maternal Aunt     pancreatic  . Diabetes Paternal Aunt     Social History Social History  Substance Use Topics  . Smoking status: Former Smoker -- 10 years    Types: Cigarettes  . Smokeless tobacco: Never Used     Comment: quit in 1994  . Alcohol Use: No    Allergies  Allergen Reactions  . Penicillins Itching  . Sulfa Antibiotics     Current Outpatient Prescriptions  Medication Sig Dispense Refill  . citalopram (CELEXA) 40 MG tablet Take 20 mg by mouth daily.     . hydrochlorothiazide (HYDRODIURIL) 25 MG tablet Take 25 mg by mouth daily.     Marland Kitchen ibuprofen (ADVIL,MOTRIN) 800 MG tablet Take 1 tablet (800 mg total) by mouth every 8 (eight) hours as needed. (Patient taking differently: Take 800 mg by mouth every 8 (eight) hours as needed for moderate pain. ) 60 tablet 3  . lisinopril (PRINIVIL,ZESTRIL) 20 MG tablet Take 20 mg by mouth daily.     . metroNIDAZOLE (METROCREAM) 0.75 %  cream Apply 1 application topically 2 (two) times daily.     Marland Kitchen nystatin (MYCOSTATIN/NYSTOP) 100000 UNIT/GM POWD Use 2-3 times daily prn to affected area 30 g 3   No current facility-administered medications for this visit.    Review of Systems Review of Systems  Constitutional: Negative.   Respiratory: Negative.   Cardiovascular: Negative.     Blood pressure 130/78, pulse 78, resp. rate 14, height $RemoveBe'5\' 4"'rpZfHRUeP$  (1.626 m), weight 262 lb 9.6 oz (119.115 kg), last menstrual period 04/09/2015.  Physical Exam Physical Exam  Constitutional: She is oriented to person, place, and time. She appears well-developed and well-nourished.  Eyes: Conjunctivae are normal. No scleral icterus.  Neck: Neck supple.  Cardiovascular: Normal rate, regular rhythm and normal heart sounds.   Pulmonary/Chest: Effort  normal and breath sounds normal. Right breast exhibits no inverted nipple, no mass, no nipple discharge, no skin change and no tenderness. Left breast exhibits skin change (well healed incision at 9 o'clk with moderate thickening). Left breast exhibits no inverted nipple, no mass, no nipple discharge and no tenderness.    Moderate volume loss lower inner left.    Lymphadenopathy:    She has no cervical adenopathy.    She has no axillary adenopathy.  Neurological: She is alert and oriented to person, place, and time.  Skin: Skin is warm and dry.  Psychiatric: She has a normal mood and affect.    Data Reviewed Bilateral diagnostic mammograms dated 04/10/2015 completed at [facility] were reviewed. Chronic calcification changes in the field of radiation surgery. No interval change. BI-RADS-2.  Assessment    Doing well now 7 years status post conservative treatment of a left breast cancer.    Plan    There is moderate asymmetry, and the patient does report she has a partial prosthesis that she rarely uses.  She had breast cancer at age 80, and would normally be considered a candidate for genetic testing. She reports attempts to have this completed 3 years ago with her GYN were unsuccessful.  Will investigate this again, as she would be a candidate for prophylactic ovary removal of positive.    Patient will be asked to return to the office in one year with a bilateral diagnotic mammogram. PCP: Anda Latina 04/20/2015, 5:16 PM

## 2015-04-20 NOTE — Patient Instructions (Addendum)
Patient will be asked to return to the office in one year with a bilateral diagnostic mammogram.  We will look into genetic testing.

## 2015-04-21 ENCOUNTER — Telehealth: Payer: Self-pay | Admitting: *Deleted

## 2015-04-21 NOTE — Telephone Encounter (Signed)
See if the patient will qualify for genetic testing: Breast cancer at age 49.

## 2015-04-22 NOTE — Telephone Encounter (Signed)
I talked with the patient in detail  And reviewed options ([facility] vs [facility]) and she will decide and call back.

## 2016-03-03 ENCOUNTER — Other Ambulatory Visit: Payer: Self-pay | Admitting: General Surgery

## 2016-03-03 DIAGNOSIS — Z853 Personal history of malignant neoplasm of breast: Secondary | ICD-10-CM

## 2016-03-09 ENCOUNTER — Inpatient Hospital Stay
Admission: RE | Admit: 2016-03-09 | Discharge: 2016-03-09 | Disposition: A | Discharge status: Home / Self Care | Payer: Self-pay | Source: Ambulatory Visit | Attending: *Deleted | Admitting: *Deleted

## 2016-03-09 ENCOUNTER — Other Ambulatory Visit: Payer: Self-pay | Admitting: *Deleted

## 2016-03-09 DIAGNOSIS — Z9289 Personal history of other medical treatment: Secondary | ICD-10-CM

## 2016-03-10 ENCOUNTER — Other Ambulatory Visit: Payer: Self-pay | Admitting: General Surgery

## 2016-03-10 DIAGNOSIS — Z1239 Encounter for other screening for malignant neoplasm of breast: Secondary | ICD-10-CM

## 2016-03-14 ENCOUNTER — Encounter: Payer: Self-pay | Admitting: *Deleted

## 2016-04-11 ENCOUNTER — Ambulatory Visit: Payer: 59

## 2016-04-15 ENCOUNTER — Ambulatory Visit
Admission: RE | Admit: 2016-04-15 | Discharge: 2016-04-15 | Disposition: A | Discharge status: Home / Self Care | Payer: 59 | Source: Ambulatory Visit | Attending: General Surgery | Admitting: General Surgery

## 2016-04-15 DIAGNOSIS — Z1231 Encounter for screening mammogram for malignant neoplasm of breast: Secondary | ICD-10-CM | POA: Diagnosis not present

## 2016-04-15 DIAGNOSIS — Z1239 Encounter for other screening for malignant neoplasm of breast: Secondary | ICD-10-CM

## 2016-04-15 HISTORY — DX: Malignant neoplasm of unspecified site of unspecified female breast: C50.919

## 2016-04-19 ENCOUNTER — Ambulatory Visit: Payer: 59 | Admitting: General Surgery

## 2016-04-25 ENCOUNTER — Encounter: Payer: Self-pay | Admitting: General Surgery

## 2016-04-25 ENCOUNTER — Ambulatory Visit (INDEPENDENT_AMBULATORY_CARE_PROVIDER_SITE_OTHER): Payer: 59 | Admitting: General Surgery

## 2016-04-25 VITALS — BP 136/88 | HR 84 | Resp 13 | Ht 64.0 in | Wt 265.0 lb

## 2016-04-25 DIAGNOSIS — Z853 Personal history of malignant neoplasm of breast: Secondary | ICD-10-CM | POA: Diagnosis not present

## 2016-04-25 NOTE — Progress Notes (Signed)
Patient ID: Deborah Barajas, female   DOB: 1965-06-16, 50 y.o.   MRN: [id number]  Chief Complaint  Patient presents with  . Follow-up    mammogram    HPI Deborah Barajas is a 50 y.o. female who presents for a breast evaluation. The most recent mammogram was done on 04/15/16.  Patient does perform regular self breast checks and gets regular mammograms done. She has no breast issues to report.  Last year we discussed BRCA testing. She never got back to [place] with a final decision. She is interested in proceeding if the cost is not prohibitive.    HPI  Past Medical History:  Diagnosis Date  . Breast cancer (H[facility]) 2009   left breast, radiation  . Cancer [facility]) 2009   Left breast, 0.6 cm, diagnosed in March of 2009. s/p lumpectomy with sn biopsy 71mm infiltrating ductal carcinoma with widely clear margins. This was histolic grade 1, sn was negative.Hormone receptor status shows strong ER/PR. HER-2/neu: Not amplified on fish. Pt has completed radiation therapy in 2009  . 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 Depression   . Herpes simplex without mention of complication   . Hyperlipidemia   . Hypertension 2009  . Irregular periods 03/12/2014  . Malignant neoplasm of lower-inner quadrant of female breast ([facility])   . Obesity, unspecified 2013  . Peri-menopausal 03/12/2014  . Personal history of tobacco use, presenting hazards to health   . Sleep apnea 2006    Past Surgical History:  Procedure Laterality Date  . BREAST BIOPSY Right 2003   neg  . BREAST SURGERY Left 2009   wide excision with sn biopsy  . CESAREAN SECTION    . COLONOSCOPY WITH PROPOFOL N/A 11/24/2014   Procedure: COLONOSCOPY WITH PROPOFOL;  Surgeon: Elnita Maxwell, MD;  Location: [facility] ENDOSCOPY;  Service: Endoscopy;  Laterality: N/A;  . FOOT SURGERY    . TONSILLECTOMY  2006    Family History  Problem Relation Age of Onset  . Cancer Other     maternal great aunt-breast  . Breast cancer Other   . Cancer Paternal  Aunt     breast   . Breast cancer Paternal Aunt   . Hypertension Mother   . Thyroid disease Mother   . Hypertension Father   . Diabetes Brother   . Cancer Maternal Aunt     lung  . Cancer Maternal Uncle     leukemia  . Hypertension Maternal Grandmother   . Cancer Maternal Grandfather     lung  . COPD Paternal Grandmother   . Heart attack Paternal Grandfather   . Thyroid disease Brother   . Heart attack Maternal Uncle   . Cancer Maternal Uncle     pancreatic  . Cancer Maternal Aunt     pancreatic  . Diabetes Paternal Aunt     Social History Social History  Substance Use Topics  . Smoking status: Current Some Day Smoker    Years: 10.00    Types: Cigarettes  . Smokeless tobacco: Never Used     Comment: quit in 1994  . Alcohol use No    Allergies  Allergen Reactions  . Penicillins Itching  . Sulfa Antibiotics     Current Outpatient Prescriptions  Medication Sig Dispense Refill  . atorvastatin (LIPITOR) 40 MG tablet Take 40 mg by mouth daily.    . hydrochlorothiazide (HYDRODIURIL) 25 MG tablet Take 10 mg by mouth daily.     Marland Kitchen venlafaxine (EFFEXOR) 75 MG tablet  Take 75 mg by mouth 2 (two) times daily.     No current facility-administered medications for this visit.     Review of Systems Review of Systems  Constitutional: Negative.   Respiratory: Negative.   Cardiovascular: Negative.     Blood pressure 136/88, pulse 84, resp. rate 13, height '5\' 4"'$  (1.626 m), weight 265 lb (120.2 kg), last menstrual period 04/06/2016.  Physical Exam Physical Exam  Constitutional: She is oriented to person, place, and time. She appears well-developed and well-nourished.  Eyes: Conjunctivae are normal. No scleral icterus.  Neck: Neck supple.  Cardiovascular: Normal rate, regular rhythm and normal heart sounds.   Pulmonary/Chest: Effort normal and breath sounds normal. Right breast exhibits no inverted nipple, no mass, no nipple discharge, no skin change and no tenderness. Left  breast exhibits no inverted nipple, no mass, no nipple discharge, no skin change and no tenderness.    Left breast minimum too cup size smaller than the right.  Lymphadenopathy:    She has no cervical adenopathy.  Neurological: She is alert and oriented to person, place, and time.  Skin: Skin is warm and dry.  Psychiatric: She has a normal mood and affect.    Data Reviewed Screening mammogram dated 04/15/2016 was reviewed. Postsurgical changes noted. BI-RADS-1.  Assessment    Benign breast exam.  Potential candidate for BRCA testing.    Plan    The patient will be contacted by the nurse to discuss and schedule BRCA sampling.  Follow-up screening mammogram in 1 year.     This has been scribed by Caryl-Lyn Otis Brace LPN    Robert Bellow 04/25/2016, 9:56 PM

## 2016-04-25 NOTE — Patient Instructions (Signed)
We will call you about genetic testing.  Follow up in one year.

## 2016-04-26 ENCOUNTER — Encounter: Payer: Self-pay | Admitting: *Deleted

## 2016-04-27 ENCOUNTER — Telehealth: Payer: Self-pay | Admitting: *Deleted

## 2016-04-27 NOTE — Telephone Encounter (Signed)
-----   Message from [facility], LPN sent at [phone]  4:54 PM EST ----- Regarding: Genetic testing Dr Bary Castilla wants you to look into Genetic testing for this patient.

## 2016-04-27 NOTE — Telephone Encounter (Signed)
appt 05/05/16.

## 2016-05-05 ENCOUNTER — Ambulatory Visit (INDEPENDENT_AMBULATORY_CARE_PROVIDER_SITE_OTHER): Payer: 59 | Admitting: *Deleted

## 2016-05-05 DIAGNOSIS — Z1379 Encounter for other screening for genetic and chromosomal anomalies: Secondary | ICD-10-CM

## 2016-05-05 DIAGNOSIS — Z853 Personal history of malignant neoplasm of breast: Secondary | ICD-10-CM

## 2016-05-05 HISTORY — DX: Encounter for other screening for genetic and chromosomal anomalies: Z13.79

## 2016-05-05 NOTE — Progress Notes (Signed)
Patient ID: Deborah Barajas, female   DOB: 03/16/1966, 50 y.o.   MRN: [id number]   Patient came in today for genetic testing, testing completed and specimen sent to [facility] per Dr Bary Castilla.

## 2016-05-05 NOTE — Patient Instructions (Signed)
The patient is aware to call back for any questions or concerns.  

## 2016-05-12 NOTE — Progress Notes (Signed)
[facility] requested we obtain approval from [facility] for BRCA1 and BRCA2 testing.  [employer] Web states order meets criteria.  Case [id number].  Expires 08/03/16

## 2016-06-02 ENCOUNTER — Encounter: Payer: Self-pay | Admitting: *Deleted

## 2016-06-02 ENCOUNTER — Telehealth: Payer: Self-pay | Admitting: *Deleted

## 2016-06-02 NOTE — Telephone Encounter (Signed)
Notify patient that genetic testing was ok per Dr Bary Castilla.

## 2016-06-02 NOTE — Telephone Encounter (Signed)
Questions answered.

## 2016-06-02 NOTE — Telephone Encounter (Signed)
Patient called back and was notified as instructed. Patient had some questions regarding the test and would like to go over them with you. Please call back on cell.

## 2016-06-07 ENCOUNTER — Encounter: Payer: Self-pay | Admitting: General Surgery

## 2016-11-03 ENCOUNTER — Encounter: Payer: Self-pay | Admitting: Adult Health

## 2016-11-03 ENCOUNTER — Ambulatory Visit (INDEPENDENT_AMBULATORY_CARE_PROVIDER_SITE_OTHER): Payer: 59 | Admitting: Adult Health

## 2016-11-03 VITALS — BP 130/88 | HR 92 | Ht 64.0 in | Wt 267.5 lb

## 2016-11-03 DIAGNOSIS — N920 Excessive and frequent menstruation with regular cycle: Secondary | ICD-10-CM | POA: Diagnosis not present

## 2016-11-03 DIAGNOSIS — L298 Other pruritus: Secondary | ICD-10-CM

## 2016-11-03 DIAGNOSIS — B369 Superficial mycosis, unspecified: Secondary | ICD-10-CM

## 2016-11-03 DIAGNOSIS — G43829 Menstrual migraine, not intractable, without status migrainosus: Secondary | ICD-10-CM

## 2016-11-03 DIAGNOSIS — N898 Other specified noninflammatory disorders of vagina: Secondary | ICD-10-CM

## 2016-11-03 DIAGNOSIS — R300 Dysuria: Secondary | ICD-10-CM | POA: Insufficient documentation

## 2016-11-03 DIAGNOSIS — Z853 Personal history of malignant neoplasm of breast: Secondary | ICD-10-CM

## 2016-11-03 DIAGNOSIS — N946 Dysmenorrhea, unspecified: Secondary | ICD-10-CM

## 2016-11-03 LAB — POCT URINALYSIS DIPSTICK
Glucose, UA: NEGATIVE
KETONES UA: NEGATIVE
LEUKOCYTES UA: NEGATIVE
Nitrite, UA: NEGATIVE
PROTEIN UA: NEGATIVE

## 2016-11-03 MED ORDER — FLUCONAZOLE 100 MG PO TABS: ORAL_TABLET | ORAL | 0 refills | Status: DC

## 2016-11-03 NOTE — Progress Notes (Signed)
Subjective:     Patient ID: Deborah Barajas, female   DOB: Oct 18, 1965, 51 y.o.   MRN: [id number]  HPI Deborah Barajas is a 51 year old white female, married in complaining of vaginal itching and discharge, painful heavy periods and headaches before her periods and burning with urination. She has a history of breast cancer. Last pap 2015, and was normal.  PCP is [facility].   Review of Systems  Vaginal itching and discharge at times Painful heavy periods Headaches before periods Burning with urination  Reviewed past medical,surgical, social and family history. Reviewed medications and allergies.     Objective:   Physical Exam BP 130/88 (BP Location: Left Arm, Patient Position: Sitting, Cuff Size: Large)   Pulse 92   Ht 5\' 4"  (1.626 m)   Wt 267 lb 8 oz (121.3 kg)   LMP 11/03/2016 (Exact Date)   BMI 45.92 kg/m urine 2+blood,(on period).Skin warm and dry.Pelvic: external genitalia is normal in appearance, except has redness and excoriation in groin area, vagina:period like blood,urethra has no lesions or masses noted, cervix:smooth and bulbous, uterus: normal size, shape and contour, non tender, no masses felt, adnexa: no masses or tenderness noted. Bladder is non tender and no masses felt.Painted groin and vaginal with gentian violet.Has red scaly areas behind both ears, ?psorias or eczema. She says she does not have diabetes. Will treat with diflucan for 10 days and get [place] with recheck in 2 weeks.     Assessment:     1. Vaginal itching   2. Superficial fungus infection of skin   3. Menorrhagia with regular cycle   4. Dysmenorrhea   5. Burning with urination   6. Menstrual migraine without status migrainosus, not intractable    7.     History of breast cancer    Plan:     Rx diflucan 100 mg #10 take 1 daily for 10 days Follow up in 2 week for GYN [place] and see me

## 2016-11-17 ENCOUNTER — Encounter: Payer: Self-pay | Admitting: Adult Health

## 2016-11-17 ENCOUNTER — Ambulatory Visit (INDEPENDENT_AMBULATORY_CARE_PROVIDER_SITE_OTHER): Payer: 59

## 2016-11-17 ENCOUNTER — Ambulatory Visit: Payer: 59 | Admitting: Adult Health

## 2016-11-17 VITALS — BP 132/86 | HR 92 | Ht 64.0 in | Wt 263.0 lb

## 2016-11-17 DIAGNOSIS — N946 Dysmenorrhea, unspecified: Secondary | ICD-10-CM | POA: Diagnosis not present

## 2016-11-17 DIAGNOSIS — N920 Excessive and frequent menstruation with regular cycle: Secondary | ICD-10-CM

## 2016-11-17 DIAGNOSIS — R319 Hematuria, unspecified: Secondary | ICD-10-CM

## 2016-11-17 DIAGNOSIS — B369 Superficial mycosis, unspecified: Secondary | ICD-10-CM

## 2016-11-17 DIAGNOSIS — Z853 Personal history of malignant neoplasm of breast: Secondary | ICD-10-CM

## 2016-11-17 DIAGNOSIS — R300 Dysuria: Secondary | ICD-10-CM | POA: Diagnosis not present

## 2016-11-17 DIAGNOSIS — L298 Other pruritus: Secondary | ICD-10-CM

## 2016-11-17 DIAGNOSIS — N898 Other specified noninflammatory disorders of vagina: Secondary | ICD-10-CM

## 2016-11-17 LAB — POCT URINALYSIS DIPSTICK
Glucose, UA: NEGATIVE
KETONES UA: NEGATIVE
NITRITE UA: NEGATIVE
PROTEIN UA: NEGATIVE

## 2016-11-17 MED ORDER — NYSTATIN-TRIAMCINOLONE 100000-0.1 UNIT/GM-% EX OINT: 1.0000 "application " | TOPICAL_OINTMENT | Freq: Two times a day (BID) | CUTANEOUS | 2 refills | Status: DC

## 2016-11-17 NOTE — Progress Notes (Signed)
PELVIC US TA/TV: heterogeneous anteverted uterus,EEC 7.2 mm,normal ovaries bilat,limited view of ovaries,no free fluid,no pain during ultrasound

## 2016-11-17 NOTE — Progress Notes (Signed)
Subjective:     Patient ID: Deborah Barajas, female   DOB: March 07, 1966, 51 y.o.   MRN: [id number]  HPI Dorian is a 51 year old white female in complaining of burning with urination and some itching and skin fungus better but still there.Had [place] for heavy periods and cramps this morning and wants results. She says headache and chest hurts before her periods.  Review of Systems Burning with urination Some vaginal itching Skin fungus Headaches and chest pain before periods Has heavy periods and cramps Reviewed past medical,surgical, social and family history. Reviewed medications and allergies.     Objective:   Physical Exam BP 132/86 (BP Location: Right Arm, Patient Position: Sitting, Cuff Size: Large)   Pulse 92   Ht '5\' 4"'$  (1.626 m)   Wt 263 lb (119.3 kg)   LMP 11/03/2016 (Exact Date)   BMI 45.14 kg/m urine dipstick trace blood and mod leuks, skin fungus under panniculus looks better less red and demarcated. US showed heterogeneous uterus with no focal masses seen and normal ovaries and endometrium 7.2 mm, will check [place] and get her to call surgeon and check on ER/PR status, was done about 6 years ago, was BRCA negative. Discussed if negative ER/PR could use megace or mirena IUD to control bleeding.    Will try mycolog for for skin fungus. UA C&S sent to rule out UTI. Assessment:       1. Burning with urination   2. Hematuria, unspecified type   3. Vaginal itching   4. Superficial fungus infection of skin   5. Menorrhagia with regular cycle   6. Dysmenorrhea   7. History of breast cancer       Plan:    UA C&S sent Meds ordered this encounter  Medications  . nystatin-triamcinolone ointment (MYCOLOG)    Sig: Apply 1 application topically 2 (two) times daily.    Dispense:  30 g    Refill:  2    Order Specific Question:   Supervising Provider    Answer:   Florian Buff [[id number]]  Check [place]  PT to call breast surgeon and ask if path was ER/PR + or not, if not can use megace  or IUD to stop periods Follow up in about 2 weeks to discuss options

## 2016-11-18 LAB — FOLLICLE STIMULATING HORMONE: FSH: 26.9 m[IU]/mL

## 2016-11-18 LAB — URINALYSIS, ROUTINE W REFLEX MICROSCOPIC
Bilirubin, UA: NEGATIVE
Glucose, UA: NEGATIVE
Ketones, UA: NEGATIVE
Nitrite, UA: NEGATIVE
PH UA: 6 (ref 5.0–7.5)
Specific Gravity, UA: 1.023 (ref 1.005–1.030)
UUROB: 0.2 mg/dL (ref 0.2–1.0)

## 2016-11-18 LAB — MICROSCOPIC EXAMINATION: Casts: NONE SEEN /lpf

## 2016-11-19 LAB — URINE CULTURE

## 2016-11-21 ENCOUNTER — Telehealth: Payer: Self-pay | Admitting: Adult Health

## 2016-11-21 MED ORDER — LEVOFLOXACIN 250 MG PO TABS: 250.0000 mg | ORAL_TABLET | Freq: Every day | ORAL | 0 refills | Status: DC

## 2016-11-21 NOTE — Telephone Encounter (Signed)
Pt aware urine +GBS, will rx levaquin

## 2016-11-29 ENCOUNTER — Ambulatory Visit (INDEPENDENT_AMBULATORY_CARE_PROVIDER_SITE_OTHER): Payer: 59 | Admitting: Adult Health

## 2016-11-29 ENCOUNTER — Encounter: Payer: Self-pay | Admitting: Adult Health

## 2016-11-29 VITALS — BP 148/88 | HR 96 | Ht 64.0 in | Wt 268.0 lb

## 2016-11-29 DIAGNOSIS — G43829 Menstrual migraine, not intractable, without status migrainosus: Secondary | ICD-10-CM

## 2016-11-29 MED ORDER — BUTALBITAL-APAP-CAFFEINE 50-325-40 MG PO TABS: 1.0000 | ORAL_TABLET | Freq: Four times a day (QID) | ORAL | 0 refills | Status: DC | PRN

## 2016-11-29 NOTE — Progress Notes (Signed)
Subjective:     Patient ID: Deborah Barajas, female   DOB: 02-22-1966, 51 y.o.   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   Review of Systems Migraines with periods Itches occasionally much better  Periods better last 2 months Reviewed past medical,surgical, social and family history. Reviewed medications and allergies.     Objective:   Physical Exam BP (!) 148/88 (BP Location: Left Arm, Patient Position: Sitting, Cuff Size: Large)   Pulse 96   Ht 5\' 4"  (1.626 m)   Wt 268 lb (121.6 kg)   LMP 11/03/2016 (Exact Date)   BMI 46.00 kg/m    Talk only, will just watch periods for now, ablation option, and will try Fioricet for migraines, follow up prn.  Continue to use mycolog cream.  Assessment:     Menstrual migraines     Plan:     Meds ordered this encounter  Medications  . butalbital-acetaminophen-caffeine (FIORICET, ESGIC) 50-325-40 MG tablet    Sig: Take 1 tablet by mouth every 6 (six) hours as needed for headache.    Dispense:  30 tablet    Refill:  0    Order Specific Question:   Supervising Provider    Answer:   Tania Ade H [[id number]]  Follow up prn

## 2017-02-20 ENCOUNTER — Other Ambulatory Visit: Payer: Self-pay

## 2017-02-20 DIAGNOSIS — Z1231 Encounter for screening mammogram for malignant neoplasm of breast: Secondary | ICD-10-CM

## 2017-04-26 ENCOUNTER — Ambulatory Visit: Payer: 59 | Admitting: General Surgery

## 2017-06-01 ENCOUNTER — Ambulatory Visit
Admission: RE | Admit: 2017-06-01 | Discharge: 2017-06-01 | Disposition: A | Discharge status: Home / Self Care | Payer: 59 | Source: Ambulatory Visit | Attending: General Surgery | Admitting: General Surgery

## 2017-06-01 DIAGNOSIS — Z1231 Encounter for screening mammogram for malignant neoplasm of breast: Secondary | ICD-10-CM | POA: Diagnosis not present

## 2017-06-06 ENCOUNTER — Ambulatory Visit: Payer: 59 | Admitting: General Surgery

## 2017-06-20 ENCOUNTER — Ambulatory Visit: Payer: 59 | Admitting: General Surgery

## 2017-07-25 ENCOUNTER — Encounter: Payer: Self-pay | Admitting: General Surgery

## 2017-07-25 ENCOUNTER — Ambulatory Visit: Payer: 59 | Admitting: General Surgery

## 2017-07-25 VITALS — BP 156/92 | HR 87 | Resp 14 | Ht 64.0 in | Wt 269.0 lb

## 2017-07-25 DIAGNOSIS — Z1231 Encounter for screening mammogram for malignant neoplasm of breast: Secondary | ICD-10-CM | POA: Diagnosis not present

## 2017-07-25 DIAGNOSIS — Z853 Personal history of malignant neoplasm of breast: Secondary | ICD-10-CM | POA: Diagnosis not present

## 2017-07-25 DIAGNOSIS — Z1239 Encounter for other screening for malignant neoplasm of breast: Secondary | ICD-10-CM

## 2017-07-25 NOTE — Patient Instructions (Signed)
Patient will be asked to return to the office in one year with a bilateral screening mammogram. The patient is aware to call back for any questions or concerns. 

## 2017-07-25 NOTE — Progress Notes (Signed)
Patient ID: Deborah Barajas, female   DOB: 09/26/1965, 52 y.o.   MRN: [id number]  Chief Complaint  Patient presents with  . Follow-up    mammogram    HPI Deborah Barajas is a 52 y.o. female who presents for a breast evaluation. The most recent mammogram was done on 06/01/17. Patient does perform regular self breast checks and gets regular mammograms done.   No new breast issues. Mother passed in Jan with pancreatic cancer.  The patient was still very weepy during the exam when speaking about her mother.  Discussed the role of counseling if she thought it would be of benefit.  She will consider this and either contact this office or her PCP in this regard.  HPI  Past Medical History:  Diagnosis Date  . Breast cancer ([place]) 2009   left breast, radiation  . Cancer [facility]) 2009   Left breast, 0.6 cm, diagnosed in March of 2009. 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 This was histolic grade 1, sn was negative.Hormone receptor status shows strong ER/PR. HER-2/neu: Not amplified on fish. Pt has completed radiation therapy in 2009  . [place]   salavary gland  . Depression   . Genetic testing 05/05/2016   negative(variant of uncertain significance)  . Herpes simplex without mention of complication   . Hyperlipidemia   . Hypertension 2009  . Irregular periods 03/12/2014  . [place] of [place]inner quadrant [place]   . Obesity, unspecified 2013  . Peri-menopausal 03/12/2014  . Personal history of tobacco use, presenting hazards to health   . Sleep apnea 2006    Past Surgical History:  Procedure Laterality Date  . BREAST BIOPSY Right 2003   neg  . BREAST SURGERY Left 2009   wide excision with sn biopsy  . CESAREAN SECTION    . COLONOSCOPY WITH PROPOFOL N/A 11/24/2014   Procedure: COLONOSCOPY WITH PROPOFOL;  Surgeon: MJosefine Class MD;  [facility];  Service: Endoscopy;  Laterality:  N/A;  . FOOT SURGERY    . TONSILLECTOMY  2006    Family History  Problem Relation Age of Onset  . Cancer Other        maternal great aunt-breast  . Breast cancer Other   . Cancer Paternal Aunt        breast   . Breast cancer Paternal Aunt   . Hypertension Mother   . Thyroid disease Mother   . Pancreatic cancer Mother   . Hypertension Father   . Diabetes Brother   . Cancer Maternal Aunt        lung and pancreatic   . Cancer Maternal Uncle        leukemia  . Hypertension Maternal Grandmother   . Cancer Maternal Grandfather        lung  . COPD Paternal Grandmother   . Heart attack Paternal Grandfather   . Thyroid disease Brother   . Heart attack Maternal Uncle   . Cancer Maternal Uncle        pancreatic  . Cancer Maternal Aunt        pancreatic  . Diabetes Paternal Aunt     Social History Social History   Tobacco Use  . Smoking status: Former Smoker    Years: 10.00    Types: Cigarettes  . Smokeless tobacco: Never Used  . Tobacco comment: quit in 1994  Substance Use Topics  . Alcohol use: No  . Drug  use: No    Allergies  Allergen Reactions  . Penicillins Itching  . Sulfa Antibiotics     Current Outpatient Medications  Medication Sig Dispense Refill  . atorvastatin (LIPITOR) 40 MG tablet Take 40 mg by mouth daily.    . butalbital-acetaminophen-caffeine (FIORICET, ESGIC) 50-325-40 MG tablet Take 1 tablet by mouth every 6 (six) hours as needed for headache. 30 tablet 0  . hydrochlorothiazide (HYDRODIURIL) 12.5 MG tablet Take 12.5 mg by mouth daily.     Marland Kitchen nystatin-triamcinolone ointment (MYCOLOG) Apply 1 application topically 2 (two) times daily. 30 g 2  . venlafaxine (EFFEXOR) 75 MG tablet Take 37.5 mg by mouth daily.      No current facility-administered medications for this visit.     Review of Systems Review of Systems  Constitutional: Negative.   Respiratory: Negative.   Cardiovascular: Negative.     Blood pressure (!) 156/92, pulse 87, resp.  rate 14, height '5\' 4"'$  (1.626 m), weight 269 lb (122 kg), last menstrual period 07/15/2017, SpO2 98 %.  Physical Exam Physical Exam  Constitutional: She is oriented to person, place, and time. She appears well-developed and well-nourished.  HENT:  Mouth/Throat: Oropharynx is clear and moist.  Eyes: Conjunctivae are normal. No scleral icterus.  Neck: Neck supple.  Cardiovascular: Normal rate, regular rhythm and normal heart sounds.  Pulmonary/Chest: Effort normal and breath sounds normal. Right breast exhibits no inverted nipple, no mass, no nipple discharge, no skin change and no tenderness. Left breast exhibits no inverted nipple, no mass, no nipple discharge, no skin change and no tenderness.    Volume lost on left  Lymphadenopathy:    She has no cervical adenopathy.    She has no axillary adenopathy.  Neurological: She is alert and oriented to person, place, and time.  Skin: Skin is warm and dry.  Psychiatric: Her behavior is normal.    Data Reviewed Bilateral screening mammograms dated June 01, 2017 reviewed.  Postsurgical change.  BI-RADS-1.  Assessment    Benign breast exam.  Strong family history of pancreatic cancer in her mother as well as paternal aunt and paternal uncle.    Plan  We will attempt to determine if there is any genetic testing routine imaging studies that would be appropriate for this patient.  Unfortunately all the affected individuals are deceased.     Patient will be asked to return to the office in one year with a bilateral screening mammogram. The patient is aware to call back for any questions or concerns.     HPI, Physical Exam, Assessment and Plan have been scribed under the direction and in the presence of Robert Bellow, MD. Karie Fetch, RN   I have completed the exam and reviewed the above documentation for accuracy and completeness.  I agree with the above.  Haematologist has been used and any errors in dictation or  transcription are unintentional.  Hervey Ard, M.D., F.A.C.S.  Forest Gleason Nyx Keady 07/25/2017, 9:06 PM

## 2017-08-02 ENCOUNTER — Telehealth: Payer: Self-pay | Admitting: *Deleted

## 2017-08-02 NOTE — Telephone Encounter (Signed)
Discussed with [facility] representative and it was recommended the patient get free genetic counseling # [phone], then have additional testing completed based on recommendations from [facility].

## 2017-08-02 NOTE — Telephone Encounter (Signed)
-----  Message from Robert Bellow, MD sent at 07/26/2017  8:24 AM EST ----- Assuming the patient does not work for [employer], see what test panel Crystal recommends to cover the following:    BRCA1/2, FAP, MMR/Lynch, Li-Farumeni (P53) as well as Peutz-Jeghers, ATM, and hereditary pancreatitis.   Thanks.

## 2017-08-02 NOTE — Telephone Encounter (Signed)
Notified patient and genetic counselor number given to her, she will call. Awaiting recommendations.

## 2017-10-09 ENCOUNTER — Ambulatory Visit (INDEPENDENT_AMBULATORY_CARE_PROVIDER_SITE_OTHER): Payer: 59 | Admitting: Adult Health

## 2017-10-09 ENCOUNTER — Encounter: Payer: Self-pay | Admitting: Adult Health

## 2017-10-09 ENCOUNTER — Other Ambulatory Visit (HOSPITAL_COMMUNITY)
Admission: RE | Admit: 2017-10-09 | Discharge: 2017-10-09 | Disposition: A | Discharge status: Home / Self Care | Payer: 59 | Source: Ambulatory Visit | Attending: Adult Health | Admitting: Adult Health

## 2017-10-09 ENCOUNTER — Other Ambulatory Visit: Payer: Self-pay

## 2017-10-09 VITALS — BP 140/82 | HR 74 | Resp 18 | Ht 64.0 in | Wt 271.0 lb

## 2017-10-09 DIAGNOSIS — B369 Superficial mycosis, unspecified: Secondary | ICD-10-CM | POA: Diagnosis not present

## 2017-10-09 DIAGNOSIS — F32A Depression, unspecified: Secondary | ICD-10-CM | POA: Insufficient documentation

## 2017-10-09 DIAGNOSIS — F329 Major depressive disorder, single episode, unspecified: Secondary | ICD-10-CM | POA: Diagnosis not present

## 2017-10-09 DIAGNOSIS — N951 Menopausal and female climacteric states: Secondary | ICD-10-CM

## 2017-10-09 DIAGNOSIS — Z01419 Encounter for gynecological examination (general) (routine) without abnormal findings: Secondary | ICD-10-CM

## 2017-10-09 DIAGNOSIS — N898 Other specified noninflammatory disorders of vagina: Secondary | ICD-10-CM | POA: Diagnosis not present

## 2017-10-09 DIAGNOSIS — Z1211 Encounter for screening for malignant neoplasm of colon: Secondary | ICD-10-CM | POA: Insufficient documentation

## 2017-10-09 DIAGNOSIS — Z1212 Encounter for screening for malignant neoplasm of rectum: Secondary | ICD-10-CM

## 2017-10-09 DIAGNOSIS — Z01411 Encounter for gynecological examination (general) (routine) with abnormal findings: Secondary | ICD-10-CM | POA: Diagnosis not present

## 2017-10-09 DIAGNOSIS — Z853 Personal history of malignant neoplasm of breast: Secondary | ICD-10-CM | POA: Diagnosis not present

## 2017-10-09 LAB — HEMOCCULT GUIAC POC 1CARD (OFFICE): FECAL OCCULT BLD: NEGATIVE

## 2017-10-09 MED ORDER — VENLAFAXINE HCL ER 75 MG PO CP24: 75.0000 mg | ORAL_CAPSULE | Freq: Every day | ORAL | 3 refills | Status: DC

## 2017-10-09 MED ORDER — NYSTATIN-TRIAMCINOLONE 100000-0.1 UNIT/GM-% EX OINT: 1.0000 "application " | TOPICAL_OINTMENT | Freq: Two times a day (BID) | CUTANEOUS | 2 refills | Status: AC

## 2017-10-09 NOTE — Progress Notes (Signed)
Patient ID: Deborah Barajas, female   DOB: 11-07-1965, 52 y.o.   MRN: [id number] History of Present Illness: Deborah Barajas is a 52 year old white female, married, in for a well woman gyn exam and pap. PCP is [facility].   Current Medications, Allergies, Past Medical History, Past Surgical History, Family History and Social History were reviewed in [employer] record.     Review of Systems: Patient denies any headaches, hearing loss, fatigue, blurred vision, shortness of breath, chest pain, abdominal pain, problems with bowel movements, urination, or intercourse. No joint pain,some hot flashes, periods are irregular, not sleeping well and can be moody.+vaginal area itches.     Physical Exam:BP 140/82 (BP Location: Right Arm, Patient Position: Sitting, Cuff Size: Large)   Pulse 74   Resp 18   Ht 5\' 4"  (1.626 m)   Wt 271 lb (122.9 kg)   LMP 09/09/2017   BMI 46.52 kg/m  General:  Well developed, well nourished, no acute distress Skin:  Warm and dry Neck:  Midline trachea, normal thyroid, good ROM, no lymphadenopathy Lungs; Clear to auscultation bilaterally Breast:  No dominant palpable mass, retraction, or nipple discharge, has hard area at 3 o'clock left breast where scar is from lumpectomy when had breast cancer  Cardiovascular: Regular rate and rhythm Abdomen:  Soft, non tender, no hepatosplenomegaly Pelvic:  External genitalia is normal in appearance, except has redness in creases of legs and vulva area..  The vagina is normal in appearance. Urethra has no lesions or masses. The cervix is bulbous.Pap with HPV performed.  Uterus is felt to be normal size, shape, and contour.  No adnexal masses or tenderness noted.Bladder is non tender, no masses felt.Painted creases of legs and vulva and vagina with gentian violet.  Rectal: Good sphincter tone, no polyps, or hemorrhoids felt.  Hemoccult negative. Extremities/musculoskeletal:  No swelling or varicosities noted, no clubbing or  cyanosis Psych:  Alert and cooperative,seems happy PHQ 9 score 19, is depressed and moody,denies being suicidal, will increase Effexor   Impression: 1. Encounter for gynecological examination with Papanicolaou smear of cervix   2. Vaginal itching   3. Superficial fungus infection of skin   4. History of breast cancer   5. Peri-menopausal   6. Screening for colorectal cancer   7. Depression, unspecified depression type       Plan: Meds ordered this encounter  Medications  . nystatin-triamcinolone ointment (MYCOLOG)    Sig: Apply 1 application topically 2 (two) times daily.    Dispense:  30 g    Refill:  2    Order Specific Question:   Supervising Provider    Answer:   Elonda Husky, LUTHER H [[id number]]  . venlafaxine XR (EFFEXOR-XR) 75 MG 24 hr capsule    Sig: Take 1 capsule (75 mg total) by mouth daily.    Dispense:  30 capsule    Refill:  3    Order Specific Question:   Supervising Provider    Answer:   Florian Buff [[id number]]   Physical in 1 year Pap in 3 if normal Labs with PCP Mammogram yearly Colonoscopy per Gi

## 2017-10-09 NOTE — Patient Instructions (Signed)
Menopause Menopause is the normal time of life when menstrual periods stop completely. Menopause is complete when you have missed 12 consecutive menstrual periods. It usually occurs between the ages of 48 years and 55 years. Very rarely does a woman develop menopause before the age of 40 years. At menopause, your ovaries stop producing the female hormones estrogen and progesterone. This can cause undesirable symptoms and also affect your health. Sometimes the symptoms may occur 4-5 years before the menopause begins. There is no relationship between menopause and:  Oral contraceptives.  Number of children you had.  Race.  The age your menstrual periods started (menarche).  Heavy smokers and very thin women may develop menopause earlier in life. What are the causes?  The ovaries stop producing the female hormones estrogen and progesterone. Other causes include:  Surgery to remove both ovaries.  The ovaries stop functioning for no known reason.  Tumors of the pituitary gland in the brain.  Medical disease that affects the ovaries and hormone production.  Radiation treatment to the abdomen or pelvis.  Chemotherapy that affects the ovaries.  What are the signs or symptoms?  Hot flashes.  Night sweats.  Decrease in sex drive.  Vaginal dryness and thinning of the vagina causing painful intercourse.  Dryness of the skin and developing wrinkles.  Headaches.  Tiredness.  Irritability.  Memory problems.  Weight gain.  Bladder infections.  Hair growth of the face and chest.  Infertility. More serious symptoms include:  Loss of bone (osteoporosis) causing breaks (fractures).  Depression.  Hardening and narrowing of the arteries (atherosclerosis) causing heart attacks and strokes.  How is this diagnosed?  When the menstrual periods have stopped for 12 straight months.  Physical exam.  Hormone studies of the blood. How is this treated? There are many treatment  choices and nearly as many questions about them. The decisions to treat or not to treat menopausal changes is an individual choice made with your health care provider. Your health care provider can discuss the treatments with you. Together, you can decide which treatment will work best for you. Your treatment choices may include:  Hormone therapy (estrogen and progesterone).  Non-hormonal medicines.  Treating the individual symptoms with medicine (for example antidepressants for depression).  Herbal medicines that may help specific symptoms.  Counseling by a psychiatrist or psychologist.  Group therapy.  Lifestyle changes including: ? Eating healthy. ? Regular exercise. ? Limiting caffeine and alcohol. ? Stress management and meditation.  No treatment.  Follow these instructions at home:  Take the medicine your health care provider gives you as directed.  Get plenty of sleep and rest.  Exercise regularly.  Eat a diet that contains calcium (good for the bones) and soy products (acts like estrogen hormone).  Avoid alcoholic beverages.  Do not smoke.  If you have hot flashes, dress in layers.  Take supplements, calcium, and vitamin D to strengthen bones.  You can use over-the-counter lubricants or moisturizers for vaginal dryness.  Group therapy is sometimes very helpful.  Acupuncture may be helpful in some cases. Contact a health care provider if:  You are not sure you are in menopause.  You are having menopausal symptoms and need advice and treatment.  You are still having menstrual periods after age 55 years.  You have pain with intercourse.  Menopause is complete (no menstrual period for 12 months) and you develop vaginal bleeding.  You need a referral to a specialist (gynecologist, psychiatrist, or psychologist) for treatment. Get help right   away if:  You have severe depression.  You have excessive vaginal bleeding.  You fell and think you have a  broken bone.  You have pain when you urinate.  You develop leg or chest pain.  You have a fast pounding heart beat (palpitations).  You have severe headaches.  You develop vision problems.  You feel a lump in your breast.  You have abdominal pain or severe indigestion. This information is not intended to replace advice given to you by your health care provider. Make sure you discuss any questions you have with your health care provider. Document Released: 08/06/2003 Document Revised: 10/22/2015 Document Reviewed: 12/13/2012 Elsevier Interactive Patient Education  2017 [place] is the time when your body begins to move into the menopause (no menstrual period for 12 straight months). It is a natural process. Perimenopause can begin 2-8 years before the menopause and usually lasts for 1 year after the menopause. During this time, your ovaries may or may not produce an egg. The ovaries vary in their production of estrogen and progesterone hormones each month. This can cause irregular menstrual periods, difficulty getting pregnant, vaginal bleeding between periods, and uncomfortable symptoms. What are the causes?  Irregular production of the ovarian hormones, estrogen and progesterone, and not ovulating every month. Other causes include:  Tumor of the pituitary gland in the brain.  Medical disease that affects the ovaries.  Radiation treatment.  Chemotherapy.  Unknown causes.  Heavy smoking and excessive alcohol intake can bring on perimenopause sooner.  What are the signs or symptoms?  Hot flashes.  Night sweats.  Irregular menstrual periods.  Decreased sex drive.  Vaginal dryness.  Headaches.  Mood swings.  Depression.  Memory problems.  Irritability.  Tiredness.  Weight gain.  Trouble getting pregnant.  The beginning of losing bone cells (osteoporosis).  The beginning of hardening of the arteries  (atherosclerosis). How is this diagnosed? Your health care provider will make a diagnosis by analyzing your age, menstrual history, and symptoms. He or she will do a physical exam and note any changes in your body, especially your female organs. Female hormone tests may or may not be helpful depending on the amount of female hormones you produce and when you produce them. However, other hormone tests may be helpful to rule out other problems. How is this treated? In some cases, no treatment is needed. The decision on whether treatment is necessary during the perimenopause should be made by you and your health care provider based on how the symptoms are affecting you and your lifestyle. Various treatments are available, such as:  Treating individual symptoms with a specific medicine for that symptom.  Herbal medicines that can help specific symptoms.  Counseling.  Group therapy.  Follow these instructions at home:  Keep track of your menstrual periods (when they occur, how heavy they are, how long between periods, and how long they last) as well as your symptoms and when they started.  Only take over-the-counter or prescription medicines as directed by your health care provider.  Sleep and rest.  Exercise.  Eat a diet that contains calcium (good for your bones) and soy (acts like the estrogen hormone).  Do not smoke.  Avoid alcoholic beverages.  Take vitamin supplements as recommended by your health care provider. Taking vitamin E may help in certain cases.  Take calcium and vitamin D supplements to help prevent bone loss.  Group therapy is sometimes helpful.  Acupuncture may help in some cases. Contact a  health care provider if:  You have questions about any symptoms you are having.  You need a referral to a specialist (gynecologist, psychiatrist, or psychologist). Get help right away if:  You have vaginal bleeding.  Your period lasts longer than 8 days.  Your periods  are recurring sooner than 21 days.  You have bleeding after intercourse.  You have severe depression.  You have pain when you urinate.  You have severe headaches.  You have vision problems. This information is not intended to replace advice given to you by your health care provider. Make sure you discuss any questions you have with your health care provider. Document Released: 06/23/2004 Document Revised: 10/22/2015 Document Reviewed: 12/13/2012 Elsevier Interactive Patient Education  2017 Elsevier Inc.  

## 2017-10-10 LAB — CYTOLOGY - PAP
Diagnosis: NEGATIVE
HPV: NOT DETECTED

## 2018-02-21 ENCOUNTER — Encounter: Payer: Self-pay | Admitting: Adult Health

## 2018-02-21 ENCOUNTER — Ambulatory Visit: Payer: 59 | Admitting: Adult Health

## 2018-02-21 VITALS — BP 150/87 | HR 70 | Ht 64.0 in | Wt 265.0 lb

## 2018-02-21 DIAGNOSIS — R300 Dysuria: Secondary | ICD-10-CM | POA: Diagnosis not present

## 2018-02-21 DIAGNOSIS — R319 Hematuria, unspecified: Secondary | ICD-10-CM | POA: Diagnosis not present

## 2018-02-21 DIAGNOSIS — N390 Urinary tract infection, site not specified: Secondary | ICD-10-CM | POA: Diagnosis not present

## 2018-02-21 LAB — POCT URINALYSIS DIPSTICK OB
Glucose, UA: NEGATIVE
Ketones, UA: NEGATIVE
Leukocytes, UA: NEGATIVE
NITRITE UA: POSITIVE

## 2018-02-21 MED ORDER — PHENAZOPYRIDINE HCL 200 MG PO TABS: 200.0000 mg | ORAL_TABLET | Freq: Three times a day (TID) | ORAL | 0 refills | Status: DC | PRN

## 2018-02-21 MED ORDER — NITROFURANTOIN MONOHYD MACRO 100 MG PO CAPS: 100.0000 mg | ORAL_CAPSULE | Freq: Two times a day (BID) | ORAL | 0 refills | Status: DC

## 2018-02-21 NOTE — Progress Notes (Signed)
  Subjective:     Patient ID: Deborah Barajas, female   DOB: 07/13/1965, 52 y.o.   MRN: [id number]  HPI Work in appt. Deborah Barajas is a 52 year old white female, married in complaining of burning when she pees, and noticed blood in her urine since last night.   Review of Systems Burning with urination and has seen blood in urine  Reviewed past medical,surgical, social and family history. Reviewed medications and allergies.     Objective:   Physical Exam BP (!) 150/87 (BP Location: Right Arm, Patient Position: Sitting, Cuff Size: Large)   Pulse 70   Ht 5\' 4"  (1.626 m)   Wt 265 lb (120.2 kg)   LMP 01/30/2018   BMI 45.49 kg/m   Urine dips tick +nitrates,trace protein, large blood. Skin warm and dry, not tender over bladder, no CVAT.    Assessment:     1. Urinary tract infection with hematuria, site unspecified   2. Burning with urination   3. Hematuria, unspecified type       Plan:     UA C&S sent Meds ordered this encounter  Medications  . nitrofurantoin, macrocrystal-monohydrate, (MACROBID) 100 MG capsule    Sig: Take 1 capsule (100 mg total) by mouth 2 (two) times daily.    Dispense:  14 capsule    Refill:  0    Order Specific Question:   Supervising Provider    Answer:   Elonda Husky, LUTHER H [[id number]]  . phenazopyridine (PYRIDIUM) 200 MG tablet    Sig: Take 1 tablet (200 mg total) by mouth 3 (three) times daily as needed for pain.    Dispense:  10 tablet    Refill:  0    Order Specific Question:   Supervising Provider    Answer:   Florian Buff [[id number]]  Push fluids Review handout on UTI F/U prn

## 2018-02-21 NOTE — Patient Instructions (Signed)

## 2018-02-22 LAB — MICROSCOPIC EXAMINATION
Casts: NONE SEEN /lpf
RBC, UA: >30 /hpf — AB (ref 0–2)

## 2018-02-22 LAB — URINALYSIS, ROUTINE W REFLEX MICROSCOPIC
Bilirubin, UA: NEGATIVE
GLUCOSE, UA: NEGATIVE
KETONES UA: NEGATIVE
Nitrite, UA: POSITIVE — AB
SPEC GRAV UA: 1.02 (ref 1.005–1.030)
Urobilinogen, Ur: 0.2 mg/dL (ref 0.2–1.0)
pH, UA: 7 (ref 5.0–7.5)

## 2018-02-23 LAB — URINE CULTURE

## 2018-03-19 ENCOUNTER — Other Ambulatory Visit: Payer: Self-pay | Admitting: Adult Health

## 2018-04-16 ENCOUNTER — Other Ambulatory Visit: Payer: Self-pay

## 2018-04-16 DIAGNOSIS — Z1231 Encounter for screening mammogram for malignant neoplasm of breast: Secondary | ICD-10-CM

## 2018-05-28 ENCOUNTER — Encounter: Payer: Self-pay | Admitting: Adult Health

## 2018-05-28 ENCOUNTER — Other Ambulatory Visit: Payer: Self-pay

## 2018-05-28 ENCOUNTER — Ambulatory Visit: Payer: 59 | Admitting: Adult Health

## 2018-05-28 VITALS — BP 158/96 | HR 110 | Ht 64.0 in | Wt 260.0 lb

## 2018-05-28 DIAGNOSIS — Z853 Personal history of malignant neoplasm of breast: Secondary | ICD-10-CM | POA: Diagnosis not present

## 2018-05-28 DIAGNOSIS — N951 Menopausal and female climacteric states: Secondary | ICD-10-CM

## 2018-05-28 DIAGNOSIS — N946 Dysmenorrhea, unspecified: Secondary | ICD-10-CM | POA: Diagnosis not present

## 2018-05-28 DIAGNOSIS — N921 Excessive and frequent menstruation with irregular cycle: Secondary | ICD-10-CM | POA: Insufficient documentation

## 2018-05-28 DIAGNOSIS — N943 Premenstrual tension syndrome: Secondary | ICD-10-CM

## 2018-05-28 NOTE — Progress Notes (Signed)
Patient ID: Deborah Barajas, female   DOB: May 11, 1966, 52 y.o.   MRN: [id number] History of Present Illness: Deborah Barajas is a 52 year old white female, married in complaining of heavy periods and cramps.Periods last about 4 days and changes super tampons about every 1-1.5 hours. She had normal GYN [place] 11/17/16.  PCP is [facility].   Current Medications, Allergies, Past Medical History, Past Surgical History, Family History and Social History were reviewed in [employer] record.     Review of Systems: +heavy periods with clots +bad cramps with periods Has skipped periods +PMS, is weaning off Effexor felt like she was in a fog +shortness of breath at times No pain with sex Gets hot at times but not flashes  She says she has been taking OTC cold meds for a week,now husband is sick     Physical Exam:BP (!) 158/96 (BP Location: Right Arm, Patient Position: Sitting, Cuff Size: Large)   Pulse (!) 110   Ht 5\' 4"  (1.626 m)   Wt 260 lb (117.9 kg)   LMP 05/08/2018   BMI 44.63 kg/m  General:  Well developed, well nourished, no acute distress Skin:  Warm and dry Lungs; Clear to auscultation bilaterally Cardiovascular: Regular rate and rhythm Psych:  No mood changes, alert and cooperative,seems happy Fall risk is low. Due to her history of breast cancer, I gave her handout on ablation, will check labs today.Discussed menopause and she thinks grandma was like 32, her mom had hysterectomy.  Make sure taking BP meds.   Impression: 1. Menorrhagia with irregular cycle   2. Dysmenorrhea   3. Peri-menopausal   4. History of breast cancer   5. PMS (premenstrual syndrome)       Plan: Check CBC,TSH and FSH Review handout on ablation Return in 2 weeks to see Dr Elonda Husky about ablation

## 2018-05-29 LAB — CBC
HEMOGLOBIN: 13.7 g/dL (ref 11.1–15.9)
Hematocrit: 40.9 % (ref 34.0–46.6)
MCH: 30.6 pg (ref 26.6–33.0)
MCHC: 33.5 g/dL (ref 31.5–35.7)
MCV: 92 fL (ref 79–97)
Platelets: 430 10*3/uL (ref 150–450)
RBC: 4.47 x10E6/uL (ref 3.77–5.28)
RDW: 12.7 % (ref 12.3–15.4)
WBC: 12.6 10*3/uL — ABNORMAL HIGH (ref 3.4–10.8)

## 2018-05-29 LAB — TSH: TSH: 2.77 u[IU]/mL (ref 0.450–4.500)

## 2018-05-29 LAB — FOLLICLE STIMULATING HORMONE: FSH: 25.7 m[IU]/mL

## 2018-05-31 ENCOUNTER — Telehealth: Payer: Self-pay | Admitting: Adult Health

## 2018-05-31 NOTE — Telephone Encounter (Signed)
Pt aware of labs, keep appt with Dr Elonda Husky

## 2018-06-05 ENCOUNTER — Ambulatory Visit
Admission: RE | Admit: 2018-06-05 | Discharge: 2018-06-05 | Disposition: A | Discharge status: Home / Self Care | Payer: 59 | Source: Ambulatory Visit | Attending: General Surgery | Admitting: General Surgery

## 2018-06-05 DIAGNOSIS — Z1231 Encounter for screening mammogram for malignant neoplasm of breast: Secondary | ICD-10-CM | POA: Insufficient documentation

## 2018-06-12 ENCOUNTER — Encounter: Payer: Self-pay | Admitting: Obstetrics & Gynecology

## 2018-06-12 ENCOUNTER — Ambulatory Visit: Payer: 59 | Admitting: General Surgery

## 2018-06-12 ENCOUNTER — Ambulatory Visit: Payer: 59 | Admitting: Obstetrics & Gynecology

## 2018-06-12 VITALS — BP 158/104 | HR 87 | Ht 64.0 in | Wt 262.0 lb

## 2018-06-12 DIAGNOSIS — N921 Excessive and frequent menstruation with irregular cycle: Secondary | ICD-10-CM | POA: Diagnosis not present

## 2018-06-12 DIAGNOSIS — N951 Menopausal and female climacteric states: Secondary | ICD-10-CM | POA: Diagnosis not present

## 2018-06-12 DIAGNOSIS — N946 Dysmenorrhea, unspecified: Secondary | ICD-10-CM | POA: Diagnosis not present

## 2018-06-12 MED ORDER — ENALAPRIL MALEATE 5 MG PO TABS: 5.0000 mg | ORAL_TABLET | Freq: Every day | ORAL | 11 refills | Status: AC

## 2018-06-12 MED ORDER — HYDROCHLOROTHIAZIDE 25 MG PO TABS: 25.0000 mg | ORAL_TABLET | Freq: Every day | ORAL | 11 refills | Status: AC

## 2018-06-12 NOTE — Progress Notes (Signed)
Preoperative History and Physical  Deborah Barajas is a 53 y.o. [id number] with Patient's last menstrual period was 05/08/2018. admitted for a hysteroscopu uterine curettage endometrial ablation. Deborah Barajas is a 53 year old white female, married in complaining of heavy periods and cramps.Periods last about 4 days and changes super tampons about every hour She did not improve at all on megestrol   PMH:    Past Medical History:  Diagnosis Date  . Breast cancer ([place]) 2009   left breast, radiation  . Cancer [facility]) 2009   Left breast, 0.6 cm, diagnosed in March of 2009. 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 This was histolic grade 1, sn was negative.Hormone receptor status shows strong ER/PR. HER-2/neu: Not amplified on fish. Pt has completed radiation therapy in 2009  . [place]   salavary gland  . Depression   . Genetic testing 05/05/2016   negative(variant of uncertain significance)  . Herpes simplex without mention of complication   . Hyperlipidemia   . Hypertension 2009  . Irregular periods 03/12/2014  . [place]inner [place]   . Obesity, unspecified 2013  . Peri-menopausal 03/12/2014  . Personal history of tobacco use, presenting hazards to health   . Sleep apnea 2006    PSH:     Past Surgical History:  Procedure Laterality Date  . BREAST BIOPSY Right 2003   neg  . BREAST LUMPECTOMY Left 2009  . BREAST SURGERY Left 2009   wide excision with sn biopsy  . CESAREAN SECTION    . COLONOSCOPY WITH PROPOFOL N/A 11/24/2014   Procedure: COLONOSCOPY WITH PROPOFOL;  Surgeon: MJosefine Class MD;  [facility];  Service: Endoscopy;  Laterality: N/A;  . FOOT SURGERY    . TONSILLECTOMY  2006    POb/GynH:      OB History    Gravida  2   Para  2   Term  2   Preterm      AB      Living  2     SAB      TAB      Ectopic      Multiple      Live Births  2         Obstetric Comments  Age with first menstruation-14 Age with first pregnancy-21        SH:   Social History   Tobacco Use  . Smoking status: Former Smoker    Years: 10.00    Types: Cigarettes  . Smokeless tobacco: Never Used  . Tobacco comment: quit in 1994  Substance Use Topics  . Alcohol use: No  . Drug use: No    FH:    Family History  Problem Relation Age of Onset  . Cancer Other        maternal great aunt-breast  . Breast cancer Other        great mat aunt  . Cancer Paternal Aunt        breast   . Breast cancer Paternal Aunt   . Hypertension Mother   . Thyroid disease Mother   . Pancreatic cancer Mother   . Hypertension Father   . Diabetes Brother   . Cancer Maternal Aunt        lung and pancreatic   . Cancer Maternal Uncle        leukemia  . Hypertension Maternal Grandmother   . Cancer Maternal Grandfather  lung  . COPD Paternal Grandmother   . Heart attack Paternal Grandfather   . Thyroid disease Brother   . Heart attack Maternal Uncle   . Cancer Maternal Uncle        pancreatic  . Cancer Maternal Aunt        pancreatic  . Diabetes Paternal Aunt      Allergies:  Allergies  Allergen Reactions  . Penicillins Itching  . Sulfa Antibiotics     Medications:       Current Outpatient Medications:  .  atorvastatin (LIPITOR) 40 MG tablet, Take 40 mg by mouth daily., Disp: , Rfl:  .  butalbital-acetaminophen-caffeine (FIORICET, ESGIC) 50-325-40 MG tablet, TAKE 1 TABLET BY MOUTH EVERY 6 HOURS AS NEEDED FOR HEADACHE, Disp: 30 tablet, Rfl: 1 .  gabapentin (NEURONTIN) 300 MG capsule, Take 300 mg by mouth as needed. , Disp: , Rfl: 3 .  hydrochlorothiazide (HYDRODIURIL) 12.5 MG tablet, Take 25 mg by mouth daily. , Disp: , Rfl:  .  metFORMIN (GLUCOPHAGE) 500 MG tablet, Take 500 mg by mouth 2 (two) times daily with a meal. , Disp: , Rfl: 3 .  nystatin-triamcinolone ointment (MYCOLOG), Apply 1 application topically 2 (two) times daily. (Patient  taking differently: Apply 1 application topically as needed. ), Disp: 30 g, Rfl: 2 .  venlafaxine XR (EFFEXOR-XR) 75 MG 24 hr capsule, Take 1 capsule (75 mg total) by mouth daily. (Patient taking differently: Take 18.75 mg by mouth daily. ), Disp: 30 capsule, Rfl: 3 .  enalapril (VASOTEC) 5 MG tablet, Take 1 tablet (5 mg total) by mouth daily., Disp: 30 tablet, Rfl: 11 .  hydrochlorothiazide (HYDRODIURIL) 25 MG tablet, Take 1 tablet (25 mg total) by mouth daily., Disp: 30 tablet, Rfl: 11  Review of Systems:   Review of Systems  Constitutional: Negative for fever, chills, weight loss, malaise/fatigue and diaphoresis.  HENT: Negative for hearing loss, ear pain, nosebleeds, congestion, sore throat, neck pain, tinnitus and ear discharge.   Eyes: Negative for blurred vision, double vision, photophobia, pain, discharge and redness.  Respiratory: Negative for cough, hemoptysis, sputum production, shortness of breath, wheezing and stridor.   Cardiovascular: Negative for chest pain, palpitations, orthopnea, claudication, leg swelling and PND.  Gastrointestinal: Positive for abdominal pain. Negative for heartburn, nausea, vomiting, diarrhea, constipation, blood in stool and melena.  Genitourinary: Negative for dysuria, urgency, frequency, hematuria and flank pain.  Musculoskeletal: Negative for myalgias, back pain, joint pain and falls.  Skin: Negative for itching and rash.  Neurological: Negative for dizziness, tingling, tremors, sensory change, speech change, focal weakness, seizures, loss of consciousness, weakness and headaches.  Endo/Heme/Allergies: Negative for environmental allergies and polydipsia. Does not bruise/bleed easily.  Psychiatric/Behavioral: Negative for depression, suicidal ideas, hallucinations, memory loss and substance abuse. The patient is not nervous/anxious and does not have insomnia.      PHYSICAL EXAM:  Blood pressure (!) 158/104, pulse 87, height '5\' 4"'$  (1.626 m), weight  262 lb (118.8 kg), last menstrual period 05/08/2018.    Vitals reviewed. Constitutional: She is oriented to person, place, and time. She appears well-developed and well-nourished.  HENT:  Head: Normocephalic and atraumatic.  Right Ear: External ear normal.  Left Ear: External ear normal.  Nose: Nose normal.  Mouth/Throat: Oropharynx is clear and moist.  Eyes: Conjunctivae and EOM are normal. Pupils are equal, round, and reactive to light. Right eye exhibits no discharge. Left eye exhibits no discharge. No scleral icterus.  Neck: Normal range of motion. Neck supple. No tracheal deviation present.  No thyromegaly present.  Cardiovascular: Normal rate, regular rhythm, normal heart sounds and intact distal pulses.  Exam reveals no gallop and no friction rub.   No murmur heard. Respiratory: Effort normal and breath sounds normal. No respiratory distress. She has no wheezes. She has no rales. She exhibits no tenderness.  GI: Soft. Bowel sounds are normal. She exhibits no distension and no mass. There is tenderness. There is no rebound and no guarding.  Genitourinary:       Vulva is normal without lesions Vagina is pink moist without discharge Cervix normal in appearance and pap is normal Uterus is normal size, contour, position, consistency, mobility, non-tender Adnexa is negative with normal sized ovaries by sonogram  Musculoskeletal: Normal range of motion. She exhibits no edema and no tenderness.  Neurological: She is alert and oriented to person, place, and time. She has normal reflexes. She displays normal reflexes. No cranial nerve deficit. She exhibits normal muscle tone. Coordination normal.  Skin: Skin is warm and dry. No rash noted. No erythema. No pallor.  Psychiatric: She has a normal mood and affect. Her behavior is normal. Judgment and thought content normal.    Labs: No results found for this or any previous visit (from the past 336 hour(s)).  EKG: Orders placed or performed  in visit on 08/10/07  . EKG 12-Lead    Imaging Studies: Mm 3d Screen Breast Bilateral  Result Date: 06/05/2018 CLINICAL DATA:  Screening. History of treated left breast cancer, status post lumpectomy in 2009. EXAM: DIGITAL SCREENING BILATERAL MAMMOGRAM WITH TOMO AND CAD COMPARISON:  Previous exam(s). ACR Breast Density Category b: There are scattered areas of fibroglandular density. FINDINGS: There are no findings suspicious for malignancy. Images were processed with CAD. IMPRESSION: No mammographic evidence of malignancy. A result letter of this screening mammogram will be mailed directly to the patient. RECOMMENDATION: Screening mammogram in one year. (Code:SM-B-01Y) BI-RADS CATEGORY  1: Negative. Electronically Signed   By: Fidela Salisbury M.D.   On: 06/05/2018 08:50      Assessment: Menorrhagia with regular cycle, perimenopausal Dysmenorrhea Unresponsive to megestrol therapy for management  Plan: Hysteroscopy uterine curettage endometrial ablation 06/27/2018  Florian Buff 06/12/2018 2:43 PM

## 2018-06-20 NOTE — Patient Instructions (Signed)
Deborah Barajas  06/20/2018     @PREFPERIOPPHARMACY @   Your procedure is scheduled on  06/27/2018.  Report to [facility] at  725  A.M.  Call this number if you have problems the morning of surgery:  [phone]   Remember:  Do not eat or drink after midnight.                         Take these medicines the morning of surgery with A SIP OF WATER fioricet, enalapril, gabapentin, effexor. DO NOT take any medications for diabetes the morning of your procedure.    Do not wear jewelry, make-up or nail polish.  Do not wear lotions, powders, or perfumes, or deodorant.  Do not shave 48 hours prior to surgery.  Men may shave face and neck.  Do not bring valuables to the hospital.  [facility] is not responsible for any belongings or valuables.  Contacts, dentures or bridgework may not be worn into surgery.  Leave your suitcase in the car.  After surgery it may be brought to your room.  For patients admitted to the hospital, discharge time will be determined by your treatment team.  Patients discharged the day of surgery will not be allowed to drive home.   Name and phone number of your driver:   family Special instructions:  None  Please read over the following fact sheets that you were given. Anesthesia Post-op Instructions and Care and Recovery After Surgery       Dilation and Curettage or Vacuum Curettage  Dilation and curettage (D&C) and vacuum curettage are minor procedures. A D&C involves stretching (dilation) the cervix and scraping (curettage) the inside lining of the uterus (endometrium). During a D&C, tissue is gently scraped from the endometrium, starting from the top portion of the uterus down to the lowest part of the uterus (cervix). During a vacuum curettage, the lining and tissue in the uterus are removed with the use of gentle suction. Curettage may be performed to either diagnose or treat a problem. As a diagnostic procedure,  curettage is performed to examine tissues from the uterus. A diagnostic curettage may be done if you have:  Irregular bleeding in the uterus.  Bleeding with the development of clots.  Spotting between menstrual periods.  Prolonged menstrual periods or other abnormal bleeding.  Bleeding after menopause.  No menstrual period (amenorrhea).  A change in size and shape of the uterus.  Abnormal endometrial cells discovered during a Pap test. As a treatment procedure, curettage may be performed for the following reasons:  Removal of an IUD (intrauterine device).  Removal of retained placenta after giving birth.  Abortion.  Miscarriage.  Removal of endometrial polyps.  Removal of uncommon types of noncancerous lumps (fibroids). Tell a health care provider about:  Any allergies you have, including allergies to prescribed medicine or latex.  All medicines you are taking, including vitamins, herbs, eye drops, creams, and over-the-counter medicines. This is especially important if you take any blood-thinning medicine. Bring a list of all of your medicines to your appointment.  Any problems you or family members have had with anesthetic medicines.  Any blood disorders you have.  Any surgeries you have had.  Your medical history and any medical conditions you have.  Whether you are pregnant or may be pregnant.  Recent vaginal infections you have had.  Recent menstrual periods,  bleeding problems you have had, and what form of birth control (contraception) you use. What are the risks? Generally, this is a safe procedure. However, problems may occur, including:  Infection.  Heavy vaginal bleeding.  Allergic reactions to medicines.  Damage to the cervix or other structures or organs.  Development of scar tissue (adhesions) inside the uterus, which can cause abnormal amounts of menstrual bleeding. This may make it harder to get pregnant in the future.  A hole (perforation)  or puncture in the uterine wall. This is rare. What happens before the procedure? Staying hydrated Follow instructions from your health care provider about hydration, which may include:  Up to 2 hours before the procedure - you may continue to drink clear liquids, such as water, clear fruit juice, black coffee, and plain tea. Eating and drinking restrictions Follow instructions from your health care provider about eating and drinking, which may include:  8 hours before the procedure - stop eating heavy meals or foods such as meat, fried foods, or fatty foods.  6 hours before the procedure - stop eating light meals or foods, such as toast or cereal.  6 hours before the procedure - stop drinking milk or drinks that contain milk.  2 hours before the procedure - stop drinking clear liquids. If your health care provider told you to take your medicine(s) on the day of your procedure, take them with only a sip of water. Medicines  Ask your health care provider about: ? Changing or stopping your regular medicines. This is especially important if you are taking diabetes medicines or blood thinners. ? Taking medicines such as aspirin and ibuprofen. These medicines can thin your blood. Do not take these medicines before your procedure if your health care provider instructs you not to.  You may be given antibiotic medicine to help prevent infection. General instructions  For 24 hours before your procedure, do not: ? Douche. ? Use tampons. ? Use medicines, creams, or suppositories in the vagina. ? Have sexual intercourse.  You may be given a pregnancy test on the day of the procedure.  Plan to have someone take you home from the hospital or clinic.  You may have a blood or urine sample taken.  If you will be going home right after the procedure, plan to have someone with you for 24 hours. What happens during the procedure?  To reduce your risk of infection: ? Your health care team will  wash or sanitize their hands. ? Your skin will be washed with soap.  An IV tube will be inserted into one of your veins.  You will be given one of the following: ? A medicine that numbs the area in and around the cervix (local anesthetic). ? A medicine to make you fall asleep (general anesthetic).  You will lie down on your back, with your feet in foot rests (stirrups).  The size and position of your uterus will be checked.  A lubricated instrument (speculum or Sims retractor) will be inserted into the back side of your vagina. The speculum will be used to hold apart the walls of your vagina so your health care provider can see your cervix.  A tool (tenaculum) will be attached to the lip of the cervix to stabilize it.  Your cervix will be softened and dilated. This may be done by: ? Taking a medicine. ? Having tapered dilators or thin rods (laminaria) or gradual widening instruments (tapered dilators) inserted into your cervix.  A small, sharp,  curved instrument (curette) will be used to scrape a small amount of tissue or cells from the endometrium or cervical canal. In some cases, gentle suction is applied with the curette. The curette will then be removed. The cells will be taken to a lab for testing. The procedure may vary among health care providers and hospitals. What happens after the procedure?  You may have mild cramping, backache, pain, and light bleeding or spotting. You may pass small blood clots from your vagina.  You may have to wear compression stockings. These stockings help to prevent blood clots and reduce swelling in your legs.  Your blood pressure, heart rate, breathing rate, and blood oxygen level will be monitored until the medicines you were given have worn off. Summary  Dilation and curettage (D&C) involves stretching (dilation) the cervix and scraping (curettage) the inside lining of the uterus (endometrium).  After the procedure, you may have mild cramping,  backache, pain, and light bleeding or spotting. You may pass small blood clots from your vagina.  Plan to have someone take you home from the hospital or clinic. This information is not intended to replace advice given to you by your health care provider. Make sure you discuss any questions you have with your health care provider. Document Released: 05/16/2005 Document Revised: 01/31/2016 Document Reviewed: 01/31/2016 Elsevier Interactive Patient Education  2019 [employer]  Dilation and Curettage or Vacuum Curettage, Care After These instructions give you information about caring for yourself after your procedure. Your doctor may also give you more specific instructions. Call your doctor if you have any problems or questions after your procedure. Follow these instructions at home: Activity  Do not drive or use heavy machinery while taking prescription pain medicine.  For 24 hours after your procedure, avoid driving.  Take short walks often, followed by rest periods. Ask your doctor what activities are safe for you. After one or two days, you may be able to return to your normal activities.  Do not lift anything that is heavier than 10 lb (4.5 kg) until your doctor approves.  For at least 2 weeks, or as long as told by your doctor: ? Do not douche. ? Do not use tampons. ? Do not have sex. General instructions   Take over-the-counter and prescription medicines only as told by your doctor. This is very important if you take blood thinning medicine.  Do not take baths, swim, or use a hot tub until your doctor approves. Take showers instead of baths.  Wear compression stockings as told by your doctor.  It is up to you to get the results of your procedure. Ask your doctor when your results will be ready.  Keep all follow-up visits as told by your doctor. This is important. Contact a doctor if:  You have very bad cramps that get worse or do not get better with medicine.  You have  very bad pain in your belly (abdomen).  You cannot drink fluids without throwing up (vomiting).  You get pain in a different part of the area between your belly and thighs (pelvis).  You have bad-smelling discharge from your vagina.  You have a rash. Get help right away if:  You are bleeding a lot from your vagina. A lot of bleeding means soaking more than one sanitary pad in an hour, for 2 hours in a row.  You have clumps of blood (blood clots) coming from your vagina.  You have a fever or chills.  Your belly feels  very tender or hard.  You have chest pain.  You have trouble breathing.  You cough up blood.  You feel dizzy.  You feel light-headed.  You pass out (faint).  You have pain in your neck or shoulder area. Summary  Take short walks often, followed by rest periods. Ask your doctor what activities are safe for you. After one or two days, you may be able to return to your normal activities.  Do not lift anything that is heavier than 10 lb (4.5 kg) until your doctor approves.  Do not take baths, swim, or use a hot tub until your doctor approves. Take showers instead of baths.  Contact your doctor if you have any symptoms of infection, like bad-smelling discharge from your vagina. This information is not intended to replace advice given to you by your health care provider. Make sure you discuss any questions you have with your health care provider. Document Released: 02/23/2008 Document Revised: 02/01/2016 Document Reviewed: 02/01/2016 Elsevier Interactive Patient Education  2019 [employer].  Hysteroscopy Hysteroscopy is a procedure that is used to examine the inside of a woman's womb (uterus). This may be done for various reasons, including:  To look for lumps (tumors) and other growths in the uterus.  To evaluate abnormal bleeding, fibroid tumors, polyps, scar tissue (adhesions), or cancer of the uterus.  To determine the cause of an inability to get  pregnant (infertility) or repeated losses of pregnancies (miscarriages).  To find a lost IUD (intrauterine device).  To perform a procedure that permanently prevents pregnancy (sterilization). During this procedure, a thin, flexible tube with a small light and camera (hysteroscope) is used to examine the uterus. The camera sends images to a monitor in the room so that your health care provider can view the inside of your uterus. A hysteroscopy should be done right after a menstrual period to make sure that you are not pregnant. Tell a health care provider about:  Any allergies you have.  All medicines you are taking, including vitamins, herbs, eye drops, creams, and over-the-counter medicines.  Any problems you or family members have had with the use of anesthetic medicines.  Any blood disorders you have.  Any surgeries you have had.  Any medical conditions you have.  Whether you are pregnant or may be pregnant. What are the risks? Generally, this is a safe procedure. However, problems may occur, including:  Excessive bleeding.  Infection.  Damage to the uterus or other structures or organs.  Allergic reaction to medicines or fluids that are used in the procedure. What happens before the procedure? Staying hydrated Follow instructions from your health care provider about hydration, which may include:  Up to 2 hours before the procedure - you may continue to drink clear liquids, such as water, clear fruit juice, black coffee, and plain tea. Eating and drinking restrictions Follow instructions from your health care provider about eating and drinking, which may include:  8 hours before the procedure - stop eating solid foods and drink clear liquids only  2 hours before the procedure - stop drinking clear liquids. General instructions  Ask your health care provider about: ? Changing or stopping your normal medicines. This is important if you take diabetes medicines or blood  thinners. ? Taking medicines such as aspirin and ibuprofen. These medicines can thin your blood and cause bleeding. Do not take these medicines for 1 week before your procedure, or as told by your health care provider.  Do not use any products  that contain nicotine or tobacco for 2 weeks before the procedure. This includes cigarettes and e-cigarettes. If you need help quitting, ask your health care provider.  Medicine may be placed in your cervix the day before the procedure. This medicine causes the cervix to have a larger opening (dilate). The larger opening makes it easier for the hysteroscope to be inserted into the uterus during the procedure.  Plan to have someone with you for the first 24-48 hours after the procedure, especially if you are given a medicine to make you fall asleep (general anesthetic).  Plan to have someone take you home from the hospital or clinic. What happens during the procedure?  To lower your risk of infection: ? Your health care team will wash or sanitize their hands. ? Your skin will be washed with soap. ? Hair may be removed from the surgical area.  An IV tube will be inserted into one of your veins.  You may be given one or more of the following: ? A medicine to help you relax (sedative). ? A medicine that numbs the area around the cervix (local anesthetic). ? A medicine to make you fall asleep (general anesthetic).  A hysteroscope will be inserted through your vagina and into your uterus.  Air or fluid will be used to enlarge your uterus, enabling your health care provider to see your uterus better. The amount of fluid used will be carefully checked throughout the procedure.  In some cases, tissue may be gently scraped from inside the uterus and sent to a lab for testing (biopsy). The procedure may vary among health care providers and hospitals. What happens after the procedure?  Your blood pressure, heart rate, breathing rate, and blood oxygen level  will be monitored until the medicines you were given have worn off.  You may have some cramping. You may be given medicines for this.  You may have bleeding, which varies from light spotting to menstrual-like bleeding. This is normal.  If you had a biopsy done, it is your responsibility to get the results of your procedure. Ask your health care provider, or the department performing the procedure, when your results will be ready. Summary  Hysteroscopy is a procedure that is used to examine the inside of a woman's womb (uterus).  After the procedure, you may have bleeding, which varies from light spotting to menstrual-like bleeding. This is normal. You may also have cramping.  Plan to have someone take you home from the hospital or clinic. This information is not intended to replace advice given to you by your health care provider. Make sure you discuss any questions you have with your health care provider. Document Released: 08/22/2000 Document Revised: 06/14/2016 Document Reviewed: 06/14/2016 Elsevier Interactive Patient Education  2019 [place].  Hysteroscopy, Care After This sheet gives you information about how to care for yourself after your procedure. Your health care provider may also give you more specific instructions. If you have problems or questions, contact your health care provider. What can I expect after the procedure? After the procedure, it is common to have:  Cramping.  Bleeding. This can vary from light spotting to menstrual-like bleeding. Follow these instructions at home: Activity  Rest for 1-2 days after the procedure.  Do not douche, use tampons, or have sex for 2 weeks after the procedure, or until your health care provider approves.  Do not drive for 24 hours after the procedure, or for as long as told by your health care provider.  Do not drive, use heavy machinery, or drink alcohol while taking prescription pain medicines. Medicines   Take  over-the-counter and prescription medicines only as told by your health care provider.  Do not take aspirin during recovery. It can increase the risk of bleeding. General instructions  Do not take baths, swim, or use a hot tub until your health care provider approves. Take showers instead of baths for 2 weeks, or for as long as told by your health care provider.  To prevent or treat constipation while you are taking prescription pain medicine, your health care provider may recommend that you: ? Drink enough fluid to keep your urine clear or pale yellow. ? Take over-the-counter or prescription medicines. ? Eat foods that are high in fiber, such as fresh fruits and vegetables, whole grains, and beans. ? Limit foods that are high in fat and processed sugars, such as fried and sweet foods.  Keep all follow-up visits as told by your health care provider. This is important. Contact a health care provider if:  You feel dizzy or lightheaded.  You feel nauseous.  You have abnormal vaginal discharge.  You have a rash.  You have pain that does not get better with medicine.  You have chills. Get help right away if:  You have bleeding that is heavier than a normal menstrual period.  You have a fever.  You have pain or cramps that get worse.  You develop new abdominal pain.  You faint.  You have pain in your shoulders.  You have shortness of breath. Summary  After the procedure, you may have cramping and some vaginal bleeding.  Do not douche, use tampons, or have sex for 2 weeks after the procedure, or until your health care provider approves.  Do not take baths, swim, or use a hot tub until your health care provider approves. Take showers instead of baths for 2 weeks, or for as long as told by your health care provider.  Report any unusual symptoms to your health care provider.  Keep all follow-up visits as told by your health care provider. This is important. This information  is not intended to replace advice given to you by your health care provider. Make sure you discuss any questions you have with your health care provider. Document Released: 03/06/2013 Document Revised: 06/14/2016 Document Reviewed: 06/14/2016 Elsevier Interactive Patient Education  2019 [place].  Endometrial Ablation Endometrial ablation is a procedure that destroys the thin inner layer of the lining of the uterus (endometrium). This procedure may be done:  To stop heavy periods.  To stop bleeding that is causing anemia.  To control irregular bleeding.  To treat bleeding caused by small tumors (fibroids) in the endometrium. This procedure is often an alternative to major surgery, such as removal of the uterus and cervix (hysterectomy). As a result of this procedure:  You may not be able to have children. However, if you are premenopausal (you have not gone through menopause): ? You may still have a small chance of getting pregnant. ? You will need to use a reliable method of birth control after the procedure to prevent pregnancy.  You may stop having a menstrual period, or you may have only a small amount of bleeding during your period. Menstruation may return several years after the procedure. Tell a health care provider about:  Any allergies you have.  All medicines you are taking, including vitamins, herbs, eye drops, creams, and over-the-counter medicines.  Any problems you or family  members have had with the use of anesthetic medicines.  Any blood disorders you have.  Any surgeries you have had.  Any medical conditions you have. What are the risks? Generally, this is a safe procedure. However, problems may occur, including:  A hole (perforation) in the uterus or bowel.  Infection of the uterus, bladder, or vagina.  Bleeding.  Damage to other structures or organs.  An air bubble in the lung (air embolus).  Problems with pregnancy after the  procedure.  Failure of the procedure.  Decreased ability to diagnose cancer in the endometrium. What happens before the procedure?  You will have tests of your endometrium to make sure there are no pre-cancerous cells or cancer cells present.  You may have an ultrasound of the uterus.  You may be given medicines to thin the endometrium.  Ask your health care provider about: ? Changing or stopping your regular medicines. This is especially important if you take diabetes medicines or blood thinners. ? Taking medicines such as aspirin and ibuprofen. These medicines can thin your blood. Do not take these medicines before your procedure if your doctor tells you not to.  Plan to have someone take you home from the hospital or clinic. What happens during the procedure?   You will lie on an exam table with your feet and legs supported as in a pelvic exam.  To lower your risk of infection: ? Your health care team will wash or sanitize their hands and put on germ-free (sterile) gloves. ? Your genital area will be washed with soap.  An IV tube will be inserted into one of your veins.  You will be given a medicine to help you relax (sedative).  A surgical instrument with a light and camera (resectoscope) will be inserted into your vagina and moved into your uterus. This allows your surgeon to see inside your uterus.  Endometrial tissue will be removed using one of the following methods: ? Radiofrequency. This method uses a radiofrequency-alternating electric current to remove the endometrium. ? Cryotherapy. This method uses extreme cold to freeze the endometrium. ? Heated-free liquid. This method uses a heated saltwater (saline) solution to remove the endometrium. ? Microwave. This method uses high-energy microwaves to heat up the endometrium and remove it. ? Thermal balloon. This method involves inserting a catheter with a balloon tip into the uterus. The balloon tip is filled with heated  fluid to remove the endometrium. The procedure may vary among health care providers and hospitals. What happens after the procedure?  Your blood pressure, heart rate, breathing rate, and blood oxygen level will be monitored until the medicines you were given have worn off.  As tissue healing occurs, you may notice vaginal bleeding for 4-6 weeks after the procedure. You may also experience: ? Cramps. ? Thin, watery vaginal discharge that is light pink or brown in color. ? A need to urinate more frequently than usual. ? Nausea.  Do not drive for 24 hours if you were given a sedative.  Do not have sex or insert anything into your vagina until your health care provider approves. Summary  Endometrial ablation is done to treat the many causes of heavy menstrual bleeding.  The procedure may be done only after medications have been tried to control the bleeding.  Plan to have someone take you home from the hospital or clinic. This information is not intended to replace advice given to you by your health care provider. Make sure you discuss any questions you  have with your health care provider. Document Released: 03/25/2004 Document Revised: 10/31/2017 Document Reviewed: 06/02/2016 Elsevier Interactive Patient Education  2019 [place] Anesthesia, Adult General anesthesia is the use of medicines to make a person "go to sleep" (unconscious) for a medical procedure. General anesthesia must be used for certain procedures, and is often recommended for procedures that:  Last a long time.  Require you to be still or in an unusual position.  Are major and can cause blood loss. The medicines used for general anesthesia are called general anesthetics. As well as making you unconscious for a certain amount of time, these medicines:  Prevent pain.  Control your blood pressure.  Relax your muscles. Tell a health care provider about:  Any allergies you have.  All medicines you  are taking, including vitamins, herbs, eye drops, creams, and over-the-counter medicines.  Any problems you or family members have had with anesthetic medicines.  Types of anesthetics you have had in the past.  Any blood disorders you have.  Any surgeries you have had.  Any medical conditions you have.  Any recent upper respiratory, chest, or ear infections.  Any history of: ? Heart or lung conditions, such as heart failure, sleep apnea, asthma, or chronic obstructive pulmonary disease (COPD). ? Armed forces logistics/support/administrative officer. ? Depression or anxiety.  Any tobacco or drug use, including marijuana or alcohol use.  Whether you are pregnant or may be pregnant. What are the risks? Generally, this is a safe procedure. However, problems may occur, including:  Allergic reaction.  Lung and heart problems.  Inhaling food or liquid from the stomach into the lungs (aspiration).  Nerve injury.  Dental injury.  Air in the bloodstream, which can lead to stroke.  Extreme agitation or confusion (delirium) when you wake up from the anesthetic.  Waking up during your procedure and being unable to move. This is rare. These problems are more likely to develop if you are having a major surgery or if you have an advanced or serious medical condition. You can prevent some of these complications by answering all of your health care provider's questions thoroughly and by following all instructions before your procedure. General anesthesia can cause side effects, including:  Nausea or vomiting.  A sore throat from the breathing tube.  Hoarseness.  Wheezing or coughing.  Shaking chills.  Tiredness.  Body aches.  Anxiety.  Sleepiness or drowsiness.  Confusion or agitation. What happens before the procedure? Staying hydrated Follow instructions from your health care provider about hydration, which may include:  Up to 2 hours before the procedure - you may continue to drink clear liquids, such  as water, clear fruit juice, black coffee, and plain tea.  Eating and drinking restrictions Follow instructions from your health care provider about eating and drinking, which may include:  8 hours before the procedure - stop eating heavy meals or foods such as meat, fried foods, or fatty foods.  6 hours before the procedure - stop eating light meals or foods, such as toast or cereal.  6 hours before the procedure - stop drinking milk or drinks that contain milk.  2 hours before the procedure - stop drinking clear liquids. Medicines Ask your health care provider about:  Changing or stopping your regular medicines. This is especially important if you are taking diabetes medicines or blood thinners.  Taking medicines such as aspirin and ibuprofen. These medicines can thin your blood. Do not take these medicines unless your health care provider tells you  to take them.  Taking over-the-counter medicines, vitamins, herbs, and supplements. Do not take these during the week before your procedure unless your health care provider approves them. General instructions  Starting 3-6 weeks before the procedure, do not use any products that contain nicotine or tobacco, such as cigarettes and e-cigarettes. If you need help quitting, ask your health care provider.  If you brush your teeth on the morning of the procedure, make sure to spit out all of the toothpaste.  Tell your health care provider if you become ill or develop a cold, cough, or fever.  If instructed by your health care provider, bring your sleep apnea device with you on the day of your surgery (if applicable).  Ask your health care provider if you will be going home the same day, the following day, or after a longer hospital stay. ? Plan to have someone take you home from the hospital or clinic. ? Plan to have a responsible adult care for you for at least 24 hours after you leave the hospital or clinic. This is important. What happens  during the procedure?   You will be given anesthetics through both of the following: ? A mask placed over your nose and mouth. ? An IV in one of your veins.  You may receive a medicine to help you relax (sedative).  After you are unconscious, a breathing tube may be inserted down your throat to help you breathe. This will be removed before you wake up.  An anesthesia specialist will stay with you throughout your procedure. He or she will: ? Keep you comfortable and safe by continuing to give you medicines and adjusting the amount of medicine that you get. ? Monitor your blood pressure, pulse, and oxygen levels to make sure that the anesthetics do not cause any problems. The procedure may vary among health care providers and hospitals. What happens after the procedure?  Your blood pressure, temperature, heart rate, breathing rate, and blood oxygen level will be monitored until the medicines you were given have worn off.  You will wake up in a recovery area. You may wake up slowly.  If you feel anxious or agitated, you may be given medicine to help you calm down.  If you will be going home the same day, your health care provider may check to make sure you can walk, drink, and urinate.  Your health care provider will treat any pain or side effects you have before you go home.  Do not drive for 24 hours if you were given a sedative. Summary  General anesthesia is used to keep you still and prevent pain during a procedure.  It is important to tell your health care provider about your medical history and any surgeries you have had, and previous experience with anesthesia.  Follow your health care provider's instructions about when to stop eating, drinking, or taking certain medicines before your procedure.  Plan to have someone take you home from the hospital or clinic. This information is not intended to replace advice given to you by your health care provider. Make sure you discuss  any questions you have with your health care provider. Document Released: 08/23/2007 Document Revised: 10/03/2017 Document Reviewed: 12/30/2016 Elsevier Interactive Patient Education  2019 [place] Anesthesia, Adult, Care After This sheet gives you information about how to care for yourself after your procedure. Your health care provider may also give you more specific instructions. If you have problems or questions, contact your health  care provider. What can I expect after the procedure? After the procedure, the following side effects are common:  Pain or discomfort at the IV site.  Nausea.  Vomiting.  Sore throat.  Trouble concentrating.  Feeling cold or chills.  Weak or tired.  Sleepiness and fatigue.  Soreness and body aches. These side effects can affect parts of the body that were not involved in surgery. Follow these instructions at home:  For at least 24 hours after the procedure:  Have a responsible adult stay with you. It is important to have someone help care for you until you are awake and alert.  Rest as needed.  Do not: ? Participate in activities in which you could fall or become injured. ? Drive. ? Use heavy machinery. ? Drink alcohol. ? Take sleeping pills or medicines that cause drowsiness. ? Make important decisions or sign legal documents. ? Take care of children on your own. Eating and drinking  Follow any instructions from your health care provider about eating or drinking restrictions.  When you feel hungry, start by eating small amounts of foods that are soft and easy to digest (bland), such as toast. Gradually return to your regular diet.  Drink enough fluid to keep your urine pale yellow.  If you vomit, rehydrate by drinking water, juice, or clear broth. General instructions  If you have sleep apnea, surgery and certain medicines can increase your risk for breathing problems. Follow instructions from your health care  provider about wearing your sleep device: ? Anytime you are sleeping, including during daytime naps. ? While taking prescription pain medicines, sleeping medicines, or medicines that make you drowsy.  Return to your normal activities as told by your health care provider. Ask your health care provider what activities are safe for you.  Take over-the-counter and prescription medicines only as told by your health care provider.  If you smoke, do not smoke without supervision.  Keep all follow-up visits as told by your health care provider. This is important. Contact a health care provider if:  You have nausea or vomiting that does not get better with medicine.  You cannot eat or drink without vomiting.  You have pain that does not get better with medicine.  You are unable to pass urine.  You develop a skin rash.  You have a fever.  You have redness around your IV site that gets worse. Get help right away if:  You have difficulty breathing.  You have chest pain.  You have blood in your urine or stool, or you vomit blood. Summary  After the procedure, it is common to have a sore throat or nausea. It is also common to feel tired.  Have a responsible adult stay with you for the first 24 hours after general anesthesia. It is important to have someone help care for you until you are awake and alert.  When you feel hungry, start by eating small amounts of foods that are soft and easy to digest (bland), such as toast. Gradually return to your regular diet.  Drink enough fluid to keep your urine pale yellow.  Return to your normal activities as told by your health care provider. Ask your health care provider what activities are safe for you. This information is not intended to replace advice given to you by your health care provider. Make sure you discuss any questions you have with your health care provider. Document Released: 08/22/2000 Document Revised: 12/30/2016 Document  Reviewed: 12/30/2016 Elsevier Interactive Patient Education  2019 [place].

## 2018-06-21 ENCOUNTER — Encounter: Payer: Self-pay | Admitting: Obstetrics & Gynecology

## 2018-06-22 ENCOUNTER — Other Ambulatory Visit: Payer: Self-pay

## 2018-06-22 ENCOUNTER — Other Ambulatory Visit: Payer: Self-pay | Admitting: Obstetrics & Gynecology

## 2018-06-22 ENCOUNTER — Encounter (HOSPITAL_COMMUNITY): Payer: Self-pay

## 2018-06-22 ENCOUNTER — Ambulatory Visit (HOSPITAL_COMMUNITY)
Admission: RE | Admit: 2018-06-22 | Discharge: 2018-06-22 | Disposition: A | Discharge status: Home / Self Care | Payer: 59 | Source: Ambulatory Visit | Attending: Obstetrics & Gynecology | Admitting: Obstetrics & Gynecology

## 2018-06-22 DIAGNOSIS — Z01818 Encounter for other preprocedural examination: Secondary | ICD-10-CM | POA: Diagnosis present

## 2018-06-22 HISTORY — DX: Restless legs syndrome: G25.81

## 2018-06-22 HISTORY — DX: Type 2 diabetes mellitus without complications: E11.9

## 2018-06-22 LAB — URINALYSIS, ROUTINE W REFLEX MICROSCOPIC
Bilirubin Urine: NEGATIVE
Glucose, UA: NEGATIVE mg/dL
HGB URINE DIPSTICK: NEGATIVE
Ketones, ur: NEGATIVE mg/dL
Nitrite: NEGATIVE
Protein, ur: NEGATIVE mg/dL
Specific Gravity, Urine: 1.023 (ref 1.005–1.030)
pH: 5 (ref 5.0–8.0)

## 2018-06-22 LAB — CBC
HCT: 40.5 % (ref 36.0–46.0)
Hemoglobin: 12.8 g/dL (ref 12.0–15.0)
MCH: 30.6 pg (ref 26.0–34.0)
MCHC: 31.6 g/dL (ref 30.0–36.0)
MCV: 96.9 fL (ref 80.0–100.0)
Platelets: 348 10*3/uL (ref 150–400)
RBC: 4.18 MIL/uL (ref 3.87–5.11)
RDW: 12.9 % (ref 11.5–15.5)
WBC: 8.9 10*3/uL (ref 4.0–10.5)
nRBC: 0 % (ref 0.0–0.2)

## 2018-06-22 LAB — HEMOGLOBIN A1C
Hgb A1c MFr Bld: 7.2 % — ABNORMAL HIGH (ref 4.8–5.6)
Mean Plasma Glucose: 159.94 mg/dL

## 2018-06-22 LAB — HCG, QUANTITATIVE, PREGNANCY: hCG, Beta Chain, Quant, S: 2 m[IU]/mL (ref <5)

## 2018-06-22 LAB — COMPREHENSIVE METABOLIC PANEL
ALT: 20 U/L (ref 0–44)
AST: 27 U/L (ref 15–41)
Albumin: 3.7 g/dL (ref 3.5–5.0)
Alkaline Phosphatase: 81 U/L (ref 38–126)
Anion gap: 12 (ref 5–15)
BUN: 11 mg/dL (ref 6–20)
CO2: 26 mmol/L (ref 22–32)
Calcium: 9.1 mg/dL (ref 8.9–10.3)
Chloride: 101 mmol/L (ref 98–111)
Creatinine, Ser: 0.69 mg/dL (ref 0.44–1.00)
GFR calc Af Amer: >60 mL/min (ref >60)
GFR calc non Af Amer: >60 mL/min (ref >60)
GLUCOSE: 211 mg/dL — AB (ref 70–99)
Potassium: 2.8 mmol/L — ABNORMAL LOW (ref 3.5–5.1)
Sodium: 139 mmol/L (ref 135–145)
Total Bilirubin: 0.6 mg/dL (ref 0.3–1.2)
Total Protein: 7.2 g/dL (ref 6.5–8.1)

## 2018-06-22 LAB — RAPID HIV SCREEN (HIV 1/2 AB+AG)
HIV 1/2 Antibodies: NONREACTIVE
HIV-1 P24 Antigen - HIV24: NONREACTIVE

## 2018-06-22 LAB — GLUCOSE, CAPILLARY: Glucose-Capillary: 228 mg/dL — ABNORMAL HIGH (ref 70–99)

## 2018-06-26 ENCOUNTER — Other Ambulatory Visit: Payer: Self-pay | Admitting: Obstetrics & Gynecology

## 2018-06-26 MED ORDER — POTASSIUM CHLORIDE ER 20 MEQ PO TBCR: EXTENDED_RELEASE_TABLET | ORAL | 0 refills | Status: DC

## 2018-06-27 ENCOUNTER — Ambulatory Visit (HOSPITAL_COMMUNITY): Payer: 59 | Admitting: Anesthesiology

## 2018-06-27 ENCOUNTER — Ambulatory Visit (HOSPITAL_COMMUNITY)
Admission: RE | Admit: 2018-06-27 | Discharge: 2018-06-27 | Disposition: A | Discharge status: Home / Self Care | Payer: 59 | Attending: Obstetrics & Gynecology | Admitting: Obstetrics & Gynecology

## 2018-06-27 ENCOUNTER — Encounter (HOSPITAL_COMMUNITY): Payer: Self-pay | Admitting: *Deleted

## 2018-06-27 ENCOUNTER — Encounter (HOSPITAL_COMMUNITY)
Admission: RE | Disposition: A | Discharge status: Home / Self Care | Payer: Self-pay | Source: Home / Self Care | Attending: Obstetrics & Gynecology

## 2018-06-27 DIAGNOSIS — Z7984 Long term (current) use of oral hypoglycemic drugs: Secondary | ICD-10-CM | POA: Diagnosis not present

## 2018-06-27 DIAGNOSIS — Z923 Personal history of irradiation: Secondary | ICD-10-CM | POA: Insufficient documentation

## 2018-06-27 DIAGNOSIS — N946 Dysmenorrhea, unspecified: Secondary | ICD-10-CM | POA: Insufficient documentation

## 2018-06-27 DIAGNOSIS — N920 Excessive and frequent menstruation with regular cycle: Secondary | ICD-10-CM | POA: Insufficient documentation

## 2018-06-27 DIAGNOSIS — Z79899 Other long term (current) drug therapy: Secondary | ICD-10-CM | POA: Diagnosis not present

## 2018-06-27 DIAGNOSIS — Z6841 Body Mass Index (BMI) 40.0 and over, adult: Secondary | ICD-10-CM | POA: Insufficient documentation

## 2018-06-27 DIAGNOSIS — E119 Type 2 diabetes mellitus without complications: Secondary | ICD-10-CM | POA: Diagnosis not present

## 2018-06-27 DIAGNOSIS — I10 Essential (primary) hypertension: Secondary | ICD-10-CM | POA: Diagnosis not present

## 2018-06-27 DIAGNOSIS — Z87891 Personal history of nicotine dependence: Secondary | ICD-10-CM | POA: Diagnosis not present

## 2018-06-27 DIAGNOSIS — G4730 Sleep apnea, unspecified: Secondary | ICD-10-CM | POA: Diagnosis not present

## 2018-06-27 DIAGNOSIS — E785 Hyperlipidemia, unspecified: Secondary | ICD-10-CM | POA: Insufficient documentation

## 2018-06-27 DIAGNOSIS — Z853 Personal history of malignant neoplasm of breast: Secondary | ICD-10-CM | POA: Diagnosis not present

## 2018-06-27 DIAGNOSIS — F329 Major depressive disorder, single episode, unspecified: Secondary | ICD-10-CM | POA: Insufficient documentation

## 2018-06-27 LAB — POCT I-STAT 4, (NA,K, GLUC, HGB,HCT)
Glucose, Bld: 132 mg/dL — ABNORMAL HIGH (ref 70–99)
HCT: 36 % (ref 36.0–46.0)
Hemoglobin: 12.2 g/dL (ref 12.0–15.0)
Potassium: 4.2 mmol/L (ref 3.5–5.1)
Sodium: 142 mmol/L (ref 135–145)

## 2018-06-27 LAB — GLUCOSE, CAPILLARY: Glucose-Capillary: 153 mg/dL — ABNORMAL HIGH (ref 70–99)

## 2018-06-27 SURGERY — DILATATION AND CURETTAGE/HYSTEROSCOPY WITH MINERVA
Anesthesia: General

## 2018-06-27 MED ORDER — HYDROMORPHONE HCL 1 MG/ML IJ SOLN: 0.2500 mg | INTRAMUSCULAR | Status: DC | PRN

## 2018-06-27 MED ORDER — LACTATED RINGERS IV SOLN: INTRAVENOUS | Status: DC

## 2018-06-27 MED ORDER — HYDROCODONE-ACETAMINOPHEN 7.5-325 MG PO TABS: 1.0000 | ORAL_TABLET | Freq: Once | ORAL | Status: DC | PRN

## 2018-06-27 MED ORDER — PROPOFOL 10 MG/ML IV BOLUS
INTRAVENOUS | Status: DC | PRN
  Administered 2018-06-27: 200 mg via INTRAVENOUS

## 2018-06-27 MED ORDER — FENTANYL CITRATE (PF) 100 MCG/2ML IJ SOLN
INTRAMUSCULAR | Status: AC
  Filled 2018-06-27: qty 2

## 2018-06-27 MED ORDER — KETOROLAC TROMETHAMINE 30 MG/ML IJ SOLN
INTRAMUSCULAR | Status: AC
  Filled 2018-06-27: qty 1

## 2018-06-27 MED ORDER — MEPERIDINE HCL 50 MG/ML IJ SOLN: 6.2500 mg | INTRAMUSCULAR | Status: DC | PRN

## 2018-06-27 MED ORDER — FENTANYL CITRATE (PF) 100 MCG/2ML IJ SOLN
INTRAMUSCULAR | Status: DC | PRN
  Administered 2018-06-27 (×2): 25 ug via INTRAVENOUS
  Administered 2018-06-27: 50 ug via INTRAVENOUS

## 2018-06-27 MED ORDER — CIPROFLOXACIN IN D5W 400 MG/200ML IV SOLN
400.0000 mg | INTRAVENOUS | Status: AC
  Administered 2018-06-27: 400 mg via INTRAVENOUS

## 2018-06-27 MED ORDER — ONDANSETRON 8 MG PO TBDP: 8.0000 mg | ORAL_TABLET | Freq: Three times a day (TID) | ORAL | 0 refills | Status: DC | PRN

## 2018-06-27 MED ORDER — PROPOFOL 10 MG/ML IV BOLUS
INTRAVENOUS | Status: AC
  Filled 2018-06-27: qty 20

## 2018-06-27 MED ORDER — HYDROCODONE-ACETAMINOPHEN 5-325 MG PO TABS: 1.0000 | ORAL_TABLET | Freq: Four times a day (QID) | ORAL | 0 refills | Status: DC | PRN

## 2018-06-27 MED ORDER — KETOROLAC TROMETHAMINE 30 MG/ML IJ SOLN
30.0000 mg | Freq: Once | INTRAMUSCULAR | Status: AC
  Administered 2018-06-27: 30 mg via INTRAVENOUS

## 2018-06-27 MED ORDER — DEXAMETHASONE SODIUM PHOSPHATE 10 MG/ML IJ SOLN
INTRAMUSCULAR | Status: DC | PRN
  Administered 2018-06-27: 5 mg via INTRAVENOUS

## 2018-06-27 MED ORDER — LACTATED RINGERS IV SOLN
INTRAVENOUS | Status: DC
  Administered 2018-06-27: 1000 mL via INTRAVENOUS

## 2018-06-27 MED ORDER — PROMETHAZINE HCL 25 MG/ML IJ SOLN: 6.2500 mg | INTRAMUSCULAR | Status: DC | PRN

## 2018-06-27 MED ORDER — KETOROLAC TROMETHAMINE 10 MG PO TABS: 10.0000 mg | ORAL_TABLET | Freq: Three times a day (TID) | ORAL | 0 refills | Status: DC | PRN

## 2018-06-27 MED ORDER — MIDAZOLAM HCL 5 MG/5ML IJ SOLN
INTRAMUSCULAR | Status: DC | PRN
  Administered 2018-06-27 (×2): 1 mg via INTRAVENOUS

## 2018-06-27 MED ORDER — 0.9 % SODIUM CHLORIDE (POUR BTL) OPTIME
TOPICAL | Status: DC | PRN
  Administered 2018-06-27: 1000 mL

## 2018-06-27 MED ORDER — CLINDAMYCIN PHOSPHATE 900 MG/50ML IV SOLN
INTRAVENOUS | Status: AC
  Filled 2018-06-27: qty 50

## 2018-06-27 MED ORDER — MIDAZOLAM HCL 2 MG/2ML IJ SOLN
INTRAMUSCULAR | Status: AC
  Filled 2018-06-27: qty 2

## 2018-06-27 MED ORDER — CIPROFLOXACIN IN D5W 400 MG/200ML IV SOLN
INTRAVENOUS | Status: AC
  Filled 2018-06-27: qty 200

## 2018-06-27 MED ORDER — CLINDAMYCIN PHOSPHATE 900 MG/50ML IV SOLN
900.0000 mg | INTRAVENOUS | Status: AC
  Administered 2018-06-27: 900 mg via INTRAVENOUS

## 2018-06-27 MED ORDER — SODIUM CHLORIDE 0.9 % IR SOLN
Status: DC | PRN
  Administered 2018-06-27: 3000 mL

## 2018-06-27 MED ORDER — ONDANSETRON HCL 4 MG/2ML IJ SOLN
INTRAMUSCULAR | Status: DC | PRN
  Administered 2018-06-27: 4 mg via INTRAVENOUS

## 2018-06-27 SURGICAL SUPPLY — 25 items
CLOTH BEACON ORANGE TIMEOUT ST (SAFETY) ×3 IMPLANT
COVER LIGHT HANDLE STERIS (MISCELLANEOUS) ×6 IMPLANT
GAUZE 4X4 16PLY RFD (DISPOSABLE) ×6 IMPLANT
GLOVE BIOGEL PI IND STRL 7.0 (GLOVE) ×2 IMPLANT
GLOVE BIOGEL PI IND STRL 8 (GLOVE) ×1 IMPLANT
GLOVE BIOGEL PI INDICATOR 7.0 (GLOVE) ×4
GLOVE BIOGEL PI INDICATOR 8 (GLOVE) ×2
GLOVE ECLIPSE 8.0 STRL XLNG CF (GLOVE) ×3 IMPLANT
GOWN STRL REUS W/TWL LRG LVL3 (GOWN DISPOSABLE) ×3 IMPLANT
GOWN STRL REUS W/TWL XL LVL3 (GOWN DISPOSABLE) ×3 IMPLANT
HANDPIECE ABLA MINERVA ENDO (MISCELLANEOUS) ×3 IMPLANT
INST SET HYSTEROSCOPY (KITS) ×3 IMPLANT
IV NS IRRIG 3000ML ARTHROMATIC (IV SOLUTION) ×2 IMPLANT
KIT TURNOVER CYSTO (KITS) ×3 IMPLANT
MANIFOLD NEPTUNE II (INSTRUMENTS) ×3 IMPLANT
MARKER SKIN DUAL TIP RULER LAB (MISCELLANEOUS) ×3 IMPLANT
NS IRRIG 1000ML POUR BTL (IV SOLUTION) ×3 IMPLANT
PACK BASIC III (CUSTOM PROCEDURE TRAY) ×2
PACK SRG BSC III STRL LF ECLPS (CUSTOM PROCEDURE TRAY) ×1 IMPLANT
PAD ARMBOARD 7.5X6 YLW CONV (MISCELLANEOUS) ×3 IMPLANT
PAD TELFA 3X4 1S STER (GAUZE/BANDAGES/DRESSINGS) ×3 IMPLANT
SET BASIN LINEN APH (SET/KITS/TRAYS/PACK) ×3 IMPLANT
SET IRRIG Y TYPE TUR BLADDER L (SET/KITS/TRAYS/PACK) ×3 IMPLANT
SHEET LAVH (DRAPES) ×3 IMPLANT
YANKAUER SUCT BULB TIP 10FT TU (MISCELLANEOUS) ×3 IMPLANT

## 2018-06-27 NOTE — Discharge Instructions (Signed)
Endometrial Ablation  Endometrial ablation is a procedure that destroys the thin inner layer of the lining of the uterus (endometrium). This procedure may be done:  To stop heavy periods.  To stop bleeding that is causing anemia.  To control irregular bleeding.  To treat bleeding caused by small tumors (fibroids) in the endometrium.  This procedure is often an alternative to major surgery, such as removal of the uterus and cervix (hysterectomy). As a result of this procedure:  You may not be able to have children. However, if you are premenopausal (you have not gone through menopause):  You may still have a small chance of getting pregnant.  You will need to use a reliable method of birth control after the procedure to prevent pregnancy.  You may stop having a menstrual period, or you may have only a small amount of bleeding during your period. Menstruation may return several years after the procedure.  Tell a health care provider about:  Any allergies you have.  All medicines you are taking, including vitamins, herbs, eye drops, creams, and over-the-counter medicines.  Any problems you or family members have had with the use of anesthetic medicines.  Any blood disorders you have.  Any surgeries you have had.  Any medical conditions you have.  What are the risks?  Generally, this is a safe procedure. However, problems may occur, including:  A hole (perforation) in the uterus or bowel.  Infection of the uterus, bladder, or vagina.  Bleeding.  Damage to other structures or organs.  An air bubble in the lung (air embolus).  Problems with pregnancy after the procedure.  Failure of the procedure.  Decreased ability to diagnose cancer in the endometrium.  What happens before the procedure?  You will have tests of your endometrium to make sure there are no pre-cancerous cells or cancer cells present.  You may have an ultrasound of the uterus.  You may be given medicines to thin the endometrium.  Ask your health care  provider about:  Changing or stopping your regular medicines. This is especially important if you take diabetes medicines or blood thinners.  Taking medicines such as aspirin and ibuprofen. These medicines can thin your blood. Do not take these medicines before your procedure if your doctor tells you not to.  Plan to have someone take you home from the hospital or clinic.  What happens during the procedure?    You will lie on an exam table with your feet and legs supported as in a pelvic exam.  To lower your risk of infection:  Your health care team will wash or sanitize their hands and put on germ-free (sterile) gloves.  Your genital area will be washed with soap.  An IV tube will be inserted into one of your veins.  You will be given a medicine to help you relax (sedative).  A surgical instrument with a light and camera (resectoscope) will be inserted into your vagina and moved into your uterus. This allows your surgeon to see inside your uterus.  Endometrial tissue will be removed using one of the following methods:  Radiofrequency. This method uses a radiofrequency-alternating electric current to remove the endometrium.  Cryotherapy. This method uses extreme cold to freeze the endometrium.  Heated-free liquid. This method uses a heated saltwater (saline) solution to remove the endometrium.  Microwave. This method uses high-energy microwaves to heat up the endometrium and remove it.  Thermal balloon. This method involves inserting a catheter with a balloon tip into   method uses a heated saltwater (saline) solution to remove the endometrium.  ? Microwave. This method uses high-energy microwaves to heat up the endometrium and remove it.  ? Thermal balloon. This method involves inserting a catheter with a balloon tip into the uterus. The balloon tip is filled with heated fluid to remove the endometrium.  The procedure may vary among health care providers and hospitals.  What happens after the procedure?  · Your blood pressure, heart rate, breathing rate, and blood oxygen level will be monitored until the medicines you were given have worn off.  · As tissue healing occurs, you may notice  vaginal bleeding for 4-6 weeks after the procedure. You may also experience:  ? Cramps.  ? Thin, watery vaginal discharge that is light pink or brown in color.  ? A need to urinate more frequently than usual.  ? Nausea.  · Do not drive for 24 hours if you were given a sedative.  · Do not have sex or insert anything into your vagina until your health care provider approves.  Summary  · Endometrial ablation is done to treat the many causes of heavy menstrual bleeding.  · The procedure may be done only after medications have been tried to control the bleeding.  · Plan to have someone take you home from the hospital or clinic.  This information is not intended to replace advice given to you by your health care provider. Make sure you discuss any questions you have with your health care provider.  Document Released: 03/25/2004 Document Revised: 10/31/2017 Document Reviewed: 06/02/2016  Elsevier Interactive Patient Education © 2019 [employer]

## 2018-06-27 NOTE — Anesthesia Procedure Notes (Signed)
Procedure Name: LMA Insertion Date/Time: 06/27/2018 8:57 AM Performed by: Charmaine Downs, CRNA Pre-anesthesia Checklist: Patient identified, Patient being monitored, Emergency Drugs available, Timeout performed and Suction available Patient Re-evaluated:Patient Re-evaluated prior to induction Oxygen Delivery Method: Circle System Utilized Preoxygenation: Pre-oxygenation with 100% oxygen Induction Type: IV induction Ventilation: Mask ventilation without difficulty LMA: LMA inserted LMA Size: 4.0 Number of attempts: 1 Placement Confirmation: positive ETCO2 and breath sounds checked- equal and bilateral Tube secured with: Tape Dental Injury: Teeth and Oropharynx as per pre-operative assessment

## 2018-06-27 NOTE — Anesthesia Postprocedure Evaluation (Signed)
Anesthesia Post Note  Patient: Deborah Barajas  Procedure(s) Performed: HYSTEROSCOPY WITH MINERVA (N/A )  Patient location during evaluation: PACU Anesthesia Type: General Level of consciousness: awake and patient cooperative Pain management: pain level controlled Vital Signs Assessment: post-procedure vital signs reviewed and stable Respiratory status: spontaneous breathing, nonlabored ventilation and respiratory function stable Cardiovascular status: blood pressure returned to baseline Postop Assessment: no apparent nausea or vomiting Anesthetic complications: no     Last Vitals:  Vitals:   06/27/18 0845 06/27/18 0933  BP:  129/78  Pulse:  89  Resp: 12 12  Temp:  36.6 C  SpO2: 96% 96%    Last Pain:  Vitals:   06/27/18 0933  TempSrc:   PainSc: 0-No pain                 Reathel Turi J

## 2018-06-27 NOTE — Op Note (Signed)
Preoperative diagnosis:  1.   Menorrhagia with regular cycle, perimenopausal                                         2.  Dysmenorrhea                                         3.  Unresponsive to megestrol therapy   Postoperative diagnoses: Same as above   Procedure: Hysteroscopy,  endometrial ablation using Minerva  Surgeon: Florian Buff   Anesthesia: Laryngeal mask airway  Findings: The endometrium was normal. There were no fibroid or other abnormalities.  Description of operation: The patient was taken to the operating room and placed in the supine position. She underwent general anesthesia using the laryngeal mask airway. She was placed in the dorsal lithotomy position and prepped and draped in the usual sterile fashion. A Graves speculum was placed and the anterior cervical lip was grasped with a single-tooth tenaculum. The cervix was dilated serially to allow passage of the hysteroscope. Diagnostic hysteroscopy was performed and was found to be normal. Because of her small cavity a curettage was not performed for fear of perforation I then proceeded to perform the Minerva endometrial ablation.   The uterus sounded to 8 cm The handpiece was attached to the Minerva power source/machine and the handpiece passed the checklist. The array was squeezed down to remove all of the air present.  The array was then place into the endometrial cavity and deployed to a length of 5 cm. The handpiece confirmed appropriate width by being in the green portion of the visual dial. The cervical cuff was then inflated to the point the CO2 indicator was in the green. The endometrial integrity check was then performed and integrity sequence was confirmed x 2. The heating was then begun and carried out for a total of 2 minutes(which is standard therapy time). When the plasma cycle was finished,  the cervical cuff was deflated and the array was removed with tissue present on the silicon membrane. There was  appropriate post Minerva bleeding and uterine discharge.     All of the equipment worked well throughout the procedure.  The patient was awakened from anesthesia and taken to the recovery room in good stable condition all counts were correct. She received Cleocin and cipro and 30 mg of Toradol preoperatively. She will be discharged from the recovery room and followed up in the office in 1- 2 weeks.   She can expect 4 weeks of post procedure bloody watery discharge  Florian Buff, MD  06/27/2018 9:24 AM

## 2018-06-27 NOTE — Transfer of Care (Signed)
Immediate Anesthesia Transfer of Care Note  Patient: Deborah Barajas  Procedure(s) Performed: HYSTEROSCOPY WITH MINERVA (N/A )  Patient Location: PACU  Anesthesia Type:General  Level of Consciousness: awake and patient cooperative  Airway & Oxygen Therapy: Patient Spontanous Breathing  Post-op Assessment: Report given to RN, Post -op Vital signs reviewed and stable and Patient moving all extremities  Post vital signs: Reviewed and stable  Last Vitals:  Vitals Value Taken Time  BP    Temp    Pulse 102 06/27/2018  9:33 AM  Resp    SpO2 96 % 06/27/2018  9:33 AM  Vitals shown include unvalidated device data.  Last Pain:  Vitals:   06/27/18 0811  TempSrc: Oral  PainSc: 0-No pain      Patients Stated Pain Goal: 8 (88/[id number])  Complications: No apparent anesthesia complications

## 2018-06-27 NOTE — H&P (Signed)
Preoperative History and Physical  Deborah Barajas is a 53 y.o. [id number] with Patient's last menstrual period was 05/08/2018. admitted for a hysteroscopu uterine curettage endometrial ablation. Deborah Barajas is a 53 year old white female, married in complaining of heavy periods and cramps.Periods last about 4 days and changes super tampons about every hour She did not improve at all on megestrol   PMH:        Past Medical History:  Diagnosis Date  . Breast cancer ([place]) 2009   left breast, radiation  . Cancer [facility]) 2009   Left breast, 0.6 cm, diagnosed in March of 2009. 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 This was histolic grade 1, sn was negative.Hormone receptor status shows strong ER/PR. HER-2/neu: Not amplified on fish. Pt has completed radiation therapy in 2009  . 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 Depression   . Genetic testing 05/05/2016   negative(variant of uncertain significance)  . Herpes simplex without mention of complication   . Hyperlipidemia   . Hypertension 2009  . Irregular periods 03/12/2014  . Malignant neoplasm of lower-inner quadrant of female breast ([place]   . Obesity, unspecified 2013  . Peri-menopausal 03/12/2014  . Personal history of tobacco use, presenting hazards to health   . Sleep apnea 2006    PSH:          Past Surgical History:  Procedure Laterality Date  . BREAST BIOPSY Right 2003   neg  . BREAST LUMPECTOMY Left 2009  . BREAST SURGERY Left 2009   wide excision with sn biopsy  . CESAREAN SECTION    . COLONOSCOPY WITH PROPOFOL N/A 11/24/2014   Procedure: COLONOSCOPY WITH PROPOFOL;  Surgeon: MJosefine Class MD;  [facility];  Service: Endoscopy;  Laterality: N/A;  . FOOT SURGERY    . TONSILLECTOMY  2006    POb/GynH:              OB History    Gravida  2   Para  2   Term  2   Preterm      AB      Living  2     SAB      TAB       Ectopic      Multiple      Live Births  2        Obstetric Comments  Age with first menstruation-14 Age with first pregnancy-21        SH:   Social History        Tobacco Use  . Smoking status: Former Smoker    Years: 10.00    Types: Cigarettes  . Smokeless tobacco: Never Used  . Tobacco comment: quit in 1994  Substance Use Topics  . Alcohol use: No  . Drug use: No    FH:         Family History  Problem Relation Age of Onset  . Cancer Other        maternal great aunt-breast  . Breast cancer Other        great mat aunt  . Cancer Paternal Aunt        breast   . Breast cancer Paternal Aunt   . Hypertension Mother   . Thyroid disease Mother   . Pancreatic cancer Mother   . Hypertension Father   . Diabetes Brother   . Cancer Maternal Aunt        lung and  pancreatic   . Cancer Maternal Uncle        leukemia  . Hypertension Maternal Grandmother   . Cancer Maternal Grandfather        lung  . COPD Paternal Grandmother   . Heart attack Paternal Grandfather   . Thyroid disease Brother   . Heart attack Maternal Uncle   . Cancer Maternal Uncle        pancreatic  . Cancer Maternal Aunt        pancreatic  . Diabetes Paternal Aunt      Allergies:      Allergies  Allergen Reactions  . Penicillins Itching  . Sulfa Antibiotics     Medications:       Current Outpatient Medications:  .  atorvastatin (LIPITOR) 40 MG tablet, Take 40 mg by mouth daily., Disp: , Rfl:  .  butalbital-acetaminophen-caffeine (FIORICET, ESGIC) 50-325-40 MG tablet, TAKE 1 TABLET BY MOUTH EVERY 6 HOURS AS NEEDED FOR HEADACHE, Disp: 30 tablet, Rfl: 1 .  gabapentin (NEURONTIN) 300 MG capsule, Take 300 mg by mouth as needed. , Disp: , Rfl: 3 .  hydrochlorothiazide (HYDRODIURIL) 12.5 MG tablet, Take 25 mg by mouth daily. , Disp: , Rfl:  .  metFORMIN (GLUCOPHAGE) 500 MG tablet, Take 500 mg by mouth 2 (two) times daily with a meal. , Disp: , Rfl:  3 .  nystatin-triamcinolone ointment (MYCOLOG), Apply 1 application topically 2 (two) times daily. (Patient taking differently: Apply 1 application topically as needed. ), Disp: 30 g, Rfl: 2 .  venlafaxine XR (EFFEXOR-XR) 75 MG 24 hr capsule, Take 1 capsule (75 mg total) by mouth daily. (Patient taking differently: Take 18.75 mg by mouth daily. ), Disp: 30 capsule, Rfl: 3 .  enalapril (VASOTEC) 5 MG tablet, Take 1 tablet (5 mg total) by mouth daily., Disp: 30 tablet, Rfl: 11 .  hydrochlorothiazide (HYDRODIURIL) 25 MG tablet, Take 1 tablet (25 mg total) by mouth daily., Disp: 30 tablet, Rfl: 11  Review of Systems:   Review of Systems  Constitutional: Negative for fever, chills, weight loss, malaise/fatigue and diaphoresis.  HENT: Negative for hearing loss, ear pain, nosebleeds, congestion, sore throat, neck pain, tinnitus and ear discharge.   Eyes: Negative for blurred vision, double vision, photophobia, pain, discharge and redness.  Respiratory: Negative for cough, hemoptysis, sputum production, shortness of breath, wheezing and stridor.   Cardiovascular: Negative for chest pain, palpitations, orthopnea, claudication, leg swelling and PND.  Gastrointestinal: Positive for abdominal pain. Negative for heartburn, nausea, vomiting, diarrhea, constipation, blood in stool and melena.  Genitourinary: Negative for dysuria, urgency, frequency, hematuria and flank pain.  Musculoskeletal: Negative for myalgias, back pain, joint pain and falls.  Skin: Negative for itching and rash.  Neurological: Negative for dizziness, tingling, tremors, sensory change, speech change, focal weakness, seizures, loss of consciousness, weakness and headaches.  Endo/Heme/Allergies: Negative for environmental allergies and polydipsia. Does not bruise/bleed easily.  Psychiatric/Behavioral: Negative for depression, suicidal ideas, hallucinations, memory loss and substance abuse. The patient is not nervous/anxious and does not  have insomnia.      PHYSICAL EXAM:  Blood pressure (!) 158/104, pulse 87, height '5\' 4"'$  (1.626 m), weight 262 lb (118.8 kg), last menstrual period 05/08/2018.    Vitals reviewed. Constitutional: She is oriented to person, place, and time. She appears well-developed and well-nourished.  HENT:  Head: Normocephalic and atraumatic.  Right Ear: External ear normal.  Left Ear: External ear normal.  Nose: Nose normal.  Mouth/Throat: Oropharynx is clear and moist.  Eyes: Conjunctivae  and EOM are normal. Pupils are equal, round, and reactive to light. Right eye exhibits no discharge. Left eye exhibits no discharge. No scleral icterus.  Neck: Normal range of motion. Neck supple. No tracheal deviation present. No thyromegaly present.  Cardiovascular: Normal rate, regular rhythm, normal heart sounds and intact distal pulses.  Exam reveals no gallop and no friction rub.   No murmur heard. Respiratory: Effort normal and breath sounds normal. No respiratory distress. She has no wheezes. She has no rales. She exhibits no tenderness.  GI: Soft. Bowel sounds are normal. She exhibits no distension and no mass. There is tenderness. There is no rebound and no guarding.  Genitourinary:       Vulva is normal without lesions Vagina is pink moist without discharge Cervix normal in appearance and pap is normal Uterus is normal size, contour, position, consistency, mobility, non-tender Adnexa is negative with normal sized ovaries by sonogram  Musculoskeletal: Normal range of motion. She exhibits no edema and no tenderness.  Neurological: She is alert and oriented to person, place, and time. She has normal reflexes. She displays normal reflexes. No cranial nerve deficit. She exhibits normal muscle tone. Coordination normal.  Skin: Skin is warm and dry. No rash noted. No erythema. No pallor.  Psychiatric: She has a normal mood and affect. Her behavior is normal. Judgment and thought content normal.     Labs: Results for orders placed or performed during the hospital encounter of 06/27/18 (from the past 168 hour(s))  I-STAT 4, (NA,K, GLUC, HGB,HCT)   Collection Time: 06/27/18  7:55 AM  Result Value Ref Range   Sodium 142 135 - 145 mmol/L   Potassium 4.2 3.5 - 5.1 mmol/L   Glucose, Bld 132 (H) 70 - 99 mg/dL   HCT 36.0 36.0 - 46.0 %   Hemoglobin 12.2 12.0 - 15.0 g/dL  Results for orders placed or performed during the hospital encounter of 06/22/18 (from the past 168 hour(s))  Glucose, capillary   Collection Time: 06/22/18  2:25 PM  Result Value Ref Range   Glucose-Capillary 228 (H) 70 - 99 mg/dL  CBC   Collection Time: 06/22/18  3:18 PM  Result Value Ref Range   WBC 8.9 4.0 - 10.5 K/uL   RBC 4.18 3.87 - 5.11 MIL/uL   Hemoglobin 12.8 12.0 - 15.0 g/dL   HCT 40.5 36.0 - 46.0 %   MCV 96.9 80.0 - 100.0 fL   MCH 30.6 26.0 - 34.0 pg   MCHC 31.6 30.0 - 36.0 g/dL   RDW 12.9 11.5 - 15.5 %   Platelets 348 150 - 400 K/uL   nRBC 0.0 0.0 - 0.2 %  Comprehensive metabolic panel   Collection Time: 06/22/18  3:18 PM  Result Value Ref Range   Sodium 139 135 - 145 mmol/L   Potassium 2.8 (L) 3.5 - 5.1 mmol/L   Chloride 101 98 - 111 mmol/L   CO2 26 22 - 32 mmol/L   Glucose, Bld 211 (H) 70 - 99 mg/dL   BUN 11 6 - 20 mg/dL   Creatinine, Ser 0.69 0.44 - 1.00 mg/dL   Calcium 9.1 8.9 - 10.3 mg/dL   Total Protein 7.2 6.5 - 8.1 g/dL   Albumin 3.7 3.5 - 5.0 g/dL   AST 27 15 - 41 U/L   ALT 20 0 - 44 U/L   Alkaline Phosphatase 81 38 - 126 U/L   Total Bilirubin 0.6 0.3 - 1.2 mg/dL   GFR calc non Af Amer >60 >60  mL/min   GFR calc Af Amer >60 >60 mL/min   Anion gap 12 5 - 15  hCG, quantitative, pregnancy   Collection Time: 06/22/18  3:18 PM  Result Value Ref Range   hCG, Beta Chain, Quant, S 2 <5 mIU/mL  Rapid HIV screen (HIV 1/2 Ab+Ag)   Collection Time: 06/22/18  3:18 PM  Result Value Ref Range   HIV-1 P24 Antigen - HIV24 NON REACTIVE NON REACTIVE   HIV 1/2 Antibodies NON REACTIVE NON  REACTIVE   Interpretation (HIV Ag Ab)      A non reactive test result means that HIV 1 or HIV 2 antibodies and HIV 1 p24 antigen were not detected in the specimen.  Hemoglobin A1c   Collection Time: 06/22/18  3:18 PM  Result Value Ref Range   Hgb A1c MFr Bld 7.2 (H) 4.8 - 5.6 %   Mean Plasma Glucose 159.94 mg/dL  Urinalysis, Routine w reflex microscopic   Collection Time: 06/22/18  3:27 PM  Result Value Ref Range   Color, Urine YELLOW YELLOW   APPearance TURBID (A) CLEAR   Specific Gravity, Urine 1.023 1.005 - 1.030   pH 5.0 5.0 - 8.0   Glucose, UA NEGATIVE NEGATIVE mg/dL   Hgb urine dipstick NEGATIVE NEGATIVE   Bilirubin Urine NEGATIVE NEGATIVE   Ketones, ur NEGATIVE NEGATIVE mg/dL   Protein, ur NEGATIVE NEGATIVE mg/dL   Nitrite NEGATIVE NEGATIVE   Leukocytes, UA MODERATE (A) NEGATIVE   RBC / HPF 0-5 0 - 5 RBC/hpf   WBC, UA 11-20 0 - 5 WBC/hpf   Bacteria, UA FEW (A) NONE SEEN   Squamous Epithelial / LPF 11-20 0 - 5   Mucus PRESENT       EKG:    Orders placed or performed in visit on 08/10/07  . EKG 12-Lead    Imaging Studies:  Imaging Results  Mm 3d Screen Breast Bilateral  Result Date: 06/05/2018 CLINICAL DATA:  Screening. History of treated left breast cancer, status post lumpectomy in 2009. EXAM: DIGITAL SCREENING BILATERAL MAMMOGRAM WITH TOMO AND CAD COMPARISON:  Previous exam(s). ACR Breast Density Category b: There are scattered areas of fibroglandular density. FINDINGS: There are no findings suspicious for malignancy. Images were processed with CAD. IMPRESSION: No mammographic evidence of malignancy. A result letter of this screening mammogram will be mailed directly to the patient. RECOMMENDATION: Screening mammogram in one year. (Code:SM-B-01Y) BI-RADS CATEGORY  1: Negative. Electronically Signed   By: Fidela Salisbury M.D.   On: 06/05/2018 08:50       Assessment: Menorrhagia with regular cycle, perimenopausal Dysmenorrhea Unresponsive to  megestrol therapy for management  Plan: Hysteroscopy uterine curettage endometrial ablation 06/27/2018  Florian Buff 06/12/2018 2:43 PM

## 2018-06-27 NOTE — Interval H&P Note (Signed)
History and Physical Interval Note:  06/27/2018 8:17 AM  Deborah Barajas  has presented today for surgery, with the diagnosis of Menorrhagia Dysmenorrhea Peri Menopausal  The various methods of treatment have been discussed with the patient and family. After consideration of risks, benefits and other options for treatment, the patient has consented to  Procedure(s): DILATATION AND CURETTAGE/HYSTEROSCOPY WITH MINERVA (N/A) as a surgical intervention .  The patient's history has been reviewed, patient examined, no change in status, stable for surgery.  I have reviewed the patient's chart and labs.  Questions were answered to the patient's satisfaction.     Florian Buff

## 2018-06-27 NOTE — Anesthesia Preprocedure Evaluation (Signed)
Anesthesia Evaluation    Airway Mallampati: II       Dental  (+) Teeth Intact, Dental Advidsory Given   Pulmonary former smoker,     + decreased breath sounds      Cardiovascular hypertension, On Medications  Rhythm:regular     Neuro/Psych  Headaches, Depression    GI/Hepatic   Endo/Other  diabetes, Type 2  Renal/GU      Musculoskeletal   Abdominal   Peds  Hematology   Anesthesia Other Findings Morbid obesity NSR 98 Breast CA hx  Reproductive/Obstetrics                             Anesthesia Physical Anesthesia Plan  ASA: III  Anesthesia Plan: General   Post-op Pain Management:    Induction:   PONV Risk Score and Plan:   Airway Management Planned:   Additional Equipment:   Intra-op Plan:   Post-operative Plan:   Informed Consent: I have reviewed the patients History and Physical, chart, labs and discussed the procedure including the risks, benefits and alternatives for the proposed anesthesia with the patient or authorized representative who has indicated his/her understanding and acceptance.       Plan Discussed with: Anesthesiologist  Anesthesia Plan Comments:         Anesthesia Quick Evaluation

## 2018-07-05 ENCOUNTER — Ambulatory Visit (INDEPENDENT_AMBULATORY_CARE_PROVIDER_SITE_OTHER): Payer: 59 | Admitting: Obstetrics & Gynecology

## 2018-07-05 ENCOUNTER — Encounter: Payer: Self-pay | Admitting: Obstetrics & Gynecology

## 2018-07-05 ENCOUNTER — Ambulatory Visit: Payer: 59 | Admitting: General Surgery

## 2018-07-05 ENCOUNTER — Encounter: Payer: Self-pay | Admitting: General Surgery

## 2018-07-05 ENCOUNTER — Other Ambulatory Visit: Payer: Self-pay

## 2018-07-05 VITALS — BP 136/87 | HR 82 | Ht 64.0 in | Wt 262.0 lb

## 2018-07-05 VITALS — BP 154/97 | HR 116 | Temp 97.5°F | Resp 20 | Ht 64.0 in | Wt 261.0 lb

## 2018-07-05 DIAGNOSIS — Z1231 Encounter for screening mammogram for malignant neoplasm of breast: Secondary | ICD-10-CM | POA: Diagnosis not present

## 2018-07-05 DIAGNOSIS — Z9889 Other specified postprocedural states: Secondary | ICD-10-CM

## 2018-07-05 NOTE — Progress Notes (Signed)
  HPI: Patient returns for routine postoperative follow-up having undergone hysteroscopy D&C with ablation on 1/29.  The patient's immediate postoperative recovery has been unremarkable. Since hospital discharge the patient reports no probems.   Current Outpatient Medications: atorvastatin (LIPITOR) 40 MG tablet, Take 40 mg by mouth daily., Disp: , Rfl:  butalbital-acetaminophen-caffeine (FIORICET, ESGIC) 50-325-40 MG tablet, TAKE 1 TABLET BY MOUTH EVERY 6 HOURS AS NEEDED FOR HEADACHE (Patient taking differently: Take 1 tablet by mouth every 6 (six) hours as needed for headache. ), Disp: 30 tablet, Rfl: 1 enalapril (VASOTEC) 5 MG tablet, Take 1 tablet (5 mg total) by mouth daily., Disp: 30 tablet, Rfl: 11 gabapentin (NEURONTIN) 300 MG capsule, Take 300 mg by mouth at bedtime as needed (pain). , Disp: , Rfl: 3 hydrochlorothiazide (HYDRODIURIL) 25 MG tablet, Take 1 tablet (25 mg total) by mouth daily., Disp: 30 tablet, Rfl: 11 HYDROcodone-acetaminophen (NORCO/VICODIN) 5-325 MG tablet, Take 1 tablet by mouth every 6 (six) hours as needed., Disp: 10 tablet, Rfl: 0 ketorolac (TORADOL) 10 MG tablet, Take 1 tablet (10 mg total) by mouth every 8 (eight) hours as needed., Disp: 15 tablet, Rfl: 0 loratadine (CLARITIN) 10 MG tablet, Take 10 mg by mouth daily as needed for allergies., Disp: , Rfl:  metFORMIN (GLUCOPHAGE) 500 MG tablet, Take 500 mg by mouth 2 (two) times daily with a meal. , Disp: , Rfl: 3 nystatin-triamcinolone ointment (MYCOLOG), Apply 1 application topically 2 (two) times daily. (Patient taking differently: Apply 1 application topically daily as needed (irritation). ), Disp: 30 g, Rfl: 2 ondansetron (ZOFRAN ODT) 8 MG disintegrating tablet, Take 1 tablet (8 mg total) by mouth every 8 (eight) hours as needed for nausea or vomiting., Disp: 20 tablet, Rfl: 0 venlafaxine XR (EFFEXOR-XR) 75 MG 24 hr capsule, Take 1 capsule (75 mg total) by mouth daily. (Patient taking differently: Take 18.75 mg by  mouth daily. 1/4 of a tablet), Disp: 30 capsule, Rfl: 3  No current facility-administered medications for this visit.     Blood pressure 136/87, pulse 82, height 5\' 4"  (1.626 m), weight 262 lb (118.8 kg), last menstrual period 06/08/2018.  Physical Exam: Normal post ablation  Diagnostic Tests:   Pathology: benign  Impression: S/p endo ablation  Plan:   Follow up: 1  years  Florian Buff, MD

## 2018-07-05 NOTE — Progress Notes (Signed)
Patient ID: Deborah Barajas, female   DOB: [birth year], 53 y.o.   MRN: [id number]  Chief Complaint  Patient presents with  . Follow-up     f/u rec Bil Screen Mammo [facility] [place] 06/05/18    HPI Deborah Barajas is a 53 y.o. female.  who presents for her follow up left breast cancer and a breast evaluation. The most recent mammogram was done on 06-05-18.  Patient does perform regular self breast checks and gets regular mammograms done.     HPI  Past Medical History:  Diagnosis Date  . Breast cancer ([place]) 2009   left breast, radiation  . Cancer [facility]) 2009   Left breast, 0.6 cm, diagnosed in March of 2009. s/p lumpectomy with sn biopsy 33m infiltrating ductal carcinoma with widely clear margins. This was histolic grade 1, sn was negative.Hormone receptor status shows strong ER/PR. HER-2/neu: Not amplified on fish. Pt has completed radiation therapy in 2009  . 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 Depression   . Diabetes mellitus without complication ([place]   . Genetic testing 05/05/2016   negative(variant of uncertain significance)  . Herpes simplex without mention of complication   . Hyperlipidemia   . Hypertension 2009  . Irregular periods 03/12/2014  . Malignant neoplasm of lower-inner quadrant of female breast ([place]   . Obesity, unspecified 2013  . Peri-menopausal 03/12/2014  . Personal history of tobacco use, presenting hazards to health   . Restless leg syndrome     Past Surgical History:  Procedure Laterality Date  . BREAST BIOPSY Right 2003   neg  . BREAST LUMPECTOMY Left 2009  . BREAST SURGERY Left 2009   wide excision with sn biopsy  . CESAREAN SECTION    . COLONOSCOPY WITH PROPOFOL N/A 11/24/2014   Procedure: COLONOSCOPY WITH PROPOFOL;  Surgeon: MJosefine Class MD;  [facility];  Service: Endoscopy;  Laterality: N/A;  . FOOT SURGERY Left   . TONSILLECTOMY  2006    Family History  Problem Relation Age of Onset  . Cancer Other        maternal great  aunt-breast  . Breast cancer Other        great mat aunt  . Cancer Paternal Aunt        breast   . Breast cancer Paternal Aunt   . Hypertension Mother   . Thyroid disease Mother   . Pancreatic cancer Mother   . Hypertension Father   . Diabetes Brother   . Cancer Maternal Aunt        lung and pancreatic   . Cancer Maternal Uncle        leukemia  . Hypertension Maternal Grandmother   . Cancer Maternal Grandfather        lung  . COPD Paternal Grandmother   . Heart attack Paternal Grandfather   . Thyroid disease Brother   . Heart attack Maternal Uncle   . Cancer Maternal Uncle        pancreatic  . Cancer Maternal Aunt        pancreatic  . Diabetes Paternal Aunt     Social History Social History   Tobacco Use  . Smoking status: Former Smoker    Packs/day: 0.25    Years: 10.00    Pack years: 2.50    Types: Cigarettes    Last attempt to quit: 06/22/1992    Years since quitting: 26.0  . Smokeless tobacco: Never Used  . Tobacco comment: quit in 1994  Substance Use Topics  . Alcohol use: No  . Drug use: No    Allergies  Allergen Reactions  . Penicillins Itching    Cillin Family  Did it involve swelling of the face/tongue/throat, SOB, or low BP? No Did it involve sudden or severe rash/hives, skin peeling, or any reaction on the inside of your mouth or nose? No Did you need to seek medical attention at a hospital or doctor's office? No When did it last happen?in her 50s If all above answers are "NO", may proceed with cephalosporin use.   . Sulfa Antibiotics     Itching, hand swelling, stomach cramps    Current Outpatient Medications  Medication Sig Dispense Refill  . atorvastatin (LIPITOR) 40 MG tablet Take 40 mg by mouth daily.    . butalbital-acetaminophen-caffeine (FIORICET, ESGIC) 50-325-40 MG tablet TAKE 1 TABLET BY MOUTH EVERY 6 HOURS AS NEEDED FOR HEADACHE (Patient taking differently: Take 1 tablet by mouth every 6 (six) hours as needed for headache. ) 30  tablet 1  . enalapril (VASOTEC) 5 MG tablet Take 1 tablet (5 mg total) by mouth daily. 30 tablet 11  . gabapentin (NEURONTIN) 300 MG capsule Take 300 mg by mouth at bedtime as needed (pain).   3  . hydrochlorothiazide (HYDRODIURIL) 25 MG tablet Take 1 tablet (25 mg total) by mouth daily. 30 tablet 11  . loratadine (CLARITIN) 10 MG tablet Take 10 mg by mouth daily as needed for allergies.    . metFORMIN (GLUCOPHAGE) 500 MG tablet Take 500 mg by mouth 2 (two) times daily with a meal.   3  . nystatin-triamcinolone ointment (MYCOLOG) Apply 1 application topically 2 (two) times daily. (Patient taking differently: Apply 1 application topically daily as needed (irritation). ) 30 g 2  . venlafaxine XR (EFFEXOR-XR) 75 MG 24 hr capsule Take 1 capsule (75 mg total) by mouth daily. (Patient taking differently: Take 18.75 mg by mouth daily. 1/4 of a tablet) 30 capsule 3   No current facility-administered medications for this visit.     Review of Systems Review of Systems  Constitutional: Negative.   Respiratory: Negative.   Cardiovascular: Negative.     Blood pressure (!) 154/97, pulse (!) 116, temperature (!) 97.5 F (36.4 C), temperature source Temporal, resp. rate 20, height '5\' 4"'$  (1.626 m), weight 261 lb (118.4 kg), last menstrual period 06/08/2018, SpO2 94 %.  Physical Exam Physical Exam Exam conducted with a chaperone present.  Constitutional:      Appearance: She is well-developed.  Eyes:     General: No scleral icterus.    Conjunctiva/sclera: Conjunctivae normal.  Neck:     Musculoskeletal: Neck supple.  Cardiovascular:     Rate and Rhythm: Normal rate and regular rhythm.     Heart sounds: Normal heart sounds.  Pulmonary:     Effort: Pulmonary effort is normal.     Breath sounds: Normal breath sounds.  Chest:     Breasts:        Right: No inverted nipple, mass, nipple discharge, skin change or tenderness.        Left: No inverted nipple, mass, nipple discharge, skin change or  tenderness.    Lymphadenopathy:     Cervical: No cervical adenopathy.     Upper Body:     Right upper body: No supraclavicular or axillary adenopathy.     Left upper body: No supraclavicular or axillary adenopathy.  Skin:    General: Skin is warm and dry.  Neurological:  Mental Status: She is alert and oriented to person, place, and time.  Psychiatric:        Behavior: Behavior normal.     Data Reviewed Bilateral screening mammograms dated June 05, 2018 reviewed.  BI-RADS-1.    Assessment    No evidence of recurrent breast cancer.    Plan    The patient was given the option of having her annual mammograms completed near her home, she declined.  We discussed the potential for genetic evaluation by the genetic counselor due to her family history with multiple members with pancreatic cancer and breast cancer.  She is amenable to this.    HPI and physical exam has been scribed under the direction and in the presence of Robert Bellow, MD. Karie Fetch, RN  I have completed the exam and reviewed the above documentation for accuracy and completeness.  I agree with the above.  Haematologist has been used and any errors in dictation or transcription are unintentional.  Hervey Ard, M.D., F.A.C.S.  Forest Gleason Byrnett 07/05/2018, 3:26 PM

## 2018-07-05 NOTE — Patient Instructions (Addendum)
The patient is aware to call back for any questions or new concerns. Patient will be asked to return to the office in one year with a bilateral screening mammogram. Recommend appointment with genetic counselor regarding family history pancreatic cancer.

## 2018-07-11 ENCOUNTER — Telehealth: Payer: Self-pay | Admitting: *Deleted

## 2018-07-11 NOTE — Telephone Encounter (Signed)
Notified patient as instructed, patient pleased. Discussed follow-up appointments, patient agrees  

## 2018-07-11 NOTE — Telephone Encounter (Signed)
-----   Message from Robert Bellow, MD sent at 07/10/2018  8:49 AM EST ----- Please notify the patient that her genetic testing done in 2017 showed no issues related to the many different cancers present in her family history. No additional testing is required at this time.

## 2018-08-07 ENCOUNTER — Other Ambulatory Visit (HOSPITAL_COMMUNITY): Payer: Self-pay | Admitting: Obstetrics & Gynecology

## 2018-12-25 ENCOUNTER — Encounter: Payer: Self-pay | Admitting: General Surgery

## 2019-05-29 ENCOUNTER — Other Ambulatory Visit: Payer: Self-pay

## 2019-05-29 DIAGNOSIS — Z1231 Encounter for screening mammogram for malignant neoplasm of breast: Secondary | ICD-10-CM

## 2019-07-08 ENCOUNTER — Ambulatory Visit: Payer: 59 | Admitting: Surgery

## 2019-08-19 ENCOUNTER — Encounter: Payer: Self-pay | Admitting: Adult Health

## 2019-08-19 ENCOUNTER — Other Ambulatory Visit: Payer: Self-pay

## 2019-08-19 ENCOUNTER — Ambulatory Visit (INDEPENDENT_AMBULATORY_CARE_PROVIDER_SITE_OTHER): Payer: 59 | Admitting: Adult Health

## 2019-08-19 VITALS — BP 152/91 | HR 90 | Ht 64.0 in | Wt 253.0 lb

## 2019-08-19 DIAGNOSIS — N898 Other specified noninflammatory disorders of vagina: Secondary | ICD-10-CM | POA: Diagnosis not present

## 2019-08-19 DIAGNOSIS — B369 Superficial mycosis, unspecified: Secondary | ICD-10-CM | POA: Diagnosis not present

## 2019-08-19 DIAGNOSIS — L814 Other melanin hyperpigmentation: Secondary | ICD-10-CM | POA: Insufficient documentation

## 2019-08-19 MED ORDER — FLUCONAZOLE 100 MG PO TABS: ORAL_TABLET | ORAL | 0 refills | Status: DC

## 2019-08-19 NOTE — Progress Notes (Signed)
  Subjective:     Patient ID: Deborah Barajas, female   DOB: 1965/11/30, 54 y.o.   MRN: [id number]  HPI Deborah Barajas is a 54 year old white female, married, PM in complaining of vaginal itching and dark spots. Has had low left back pain and saw PCP last week.  PCP is Burman Freestone NP.  Review of Systems Vaginal itching Has noticed 2 dark spots in vaginal area  Reviewed past medical,surgical, social and family history. Reviewed medications and allergies.     Objective:   Physical Exam BP (!) 152/91 (BP Location: Right Arm, Patient Position: Sitting, Cuff Size: Large)   Pulse 90   Ht 5\' 4"  (1.626 m)   Wt 253 lb (114.8 kg)   BMI 43.43 kg/m fall risk is moderate Skin warm and dry.Pelvic: external genitalia, has redness in groin area with several cracks, has 2-3  Dark skin area near clitoris, pained groin, labia and vagina with gentian violet,   vagina:scant discharge without odor,urethra has no lesions or masses noted, cervix:smooth and bulbous, uterus: normal size, shape and contour, non tender, no masses felt, adnexa: no masses or tenderness noted. Bladder is non tender and no masses felt.  Examination chaperoned by Levy Pupa LPN.     Assessment:    1. Vaginal itching Painted with gentian violet and will rx diflucan  2. Infection of skin and subcutaneous tissue due to fungus Painted with gentian violet and will rx diflucan Meds ordered this encounter  Medications  . fluconazole (DIFLUCAN) 100 MG tablet    Sig: Take 1 daily for 7 days    Dispense:  1 tablet    Refill:  0    Order Specific Question:   Supervising Provider    Answer:   Elonda Husky, LUTHER H [[id number]]    3. Darkening of skin Will recheck in 10 days      Plan:     Follow up in 10 days

## 2019-08-27 ENCOUNTER — Telehealth: Payer: Self-pay | Admitting: Adult Health

## 2019-08-27 NOTE — Telephone Encounter (Signed)

## 2019-08-29 ENCOUNTER — Ambulatory Visit: Payer: 59 | Admitting: Adult Health

## 2019-08-29 ENCOUNTER — Encounter: Payer: Self-pay | Admitting: Adult Health

## 2019-08-29 ENCOUNTER — Other Ambulatory Visit: Payer: Self-pay

## 2019-08-29 VITALS — BP 137/84 | HR 73 | Ht 64.0 in | Wt 251.0 lb

## 2019-08-29 DIAGNOSIS — N898 Other specified noninflammatory disorders of vagina: Secondary | ICD-10-CM | POA: Diagnosis not present

## 2019-08-29 DIAGNOSIS — B369 Superficial mycosis, unspecified: Secondary | ICD-10-CM | POA: Diagnosis not present

## 2019-08-29 MED ORDER — NYSTATIN POWD: 1 refills | Status: DC

## 2019-08-29 NOTE — Progress Notes (Signed)
  Subjective:     Patient ID: Deborah Barajas, female   DOB: 1965-06-30, 54 y.o.   MRN: [id number]  HPI Deborah Barajas is a 54 year old white female,married, PM back in follow up of vaginal itching and treated with diflucan and gentian violet and is better but still itching some. PCP is Burman Freestone NP.  Review of Systems Feels much better but still some itching  Reviewed past medical,surgical, social and family history. Reviewed medications and allergies.     Objective:   Physical Exam BP 137/84 (BP Location: Right Arm, Patient Position: Sitting, Cuff Size: Large)   Pulse 73   Ht 5\' 4"  (1.626 m)   Wt 251 lb (113.9 kg)   BMI 43.08 kg/m   Skin warm and dry.Pelvic: external genitalia has less redness in groin, but still has cracks in skin, not has tender, vagina: no discharge noted, still has some purple near clitoris, so could not assess skin there for darkness,urethra has no lesions or masses noted, cervix:smooth and bulbous, uterus: normal size, shape and contour, non tender, no masses felt, adnexa: no masses or tenderness noted. Bladder is non tender and no masses felt. Painted groin,vulva and vagina with gentian violet. Examination chaperoned by Levy Pupa LPN    Assessment:     1. Vaginal itching It is better but retreated with gentian violet   2. Infection of skin and subcutaneous tissue due to fungus Painted with gentian violet Will rx nystatin powder to use in crease of legs  Meds ordered this encounter  Medications  . Nystatin POWD    Sig: Use bid to affected areas    Dispense:  1 each    Refill:  1    Order Specific Question:   Supervising Provider    Answer:   Florian Buff [[id number]]      Plan:     Recheck in 2 weeks

## 2019-09-11 ENCOUNTER — Telehealth: Payer: Self-pay | Admitting: Adult Health

## 2019-09-11 NOTE — Telephone Encounter (Signed)

## 2019-09-12 ENCOUNTER — Ambulatory Visit: Payer: 59 | Admitting: Adult Health

## 2019-09-12 ENCOUNTER — Encounter: Payer: Self-pay | Admitting: Adult Health

## 2019-09-12 ENCOUNTER — Other Ambulatory Visit: Payer: Self-pay

## 2019-09-12 VITALS — BP 134/82 | HR 71 | Ht 64.0 in | Wt 254.0 lb

## 2019-09-12 DIAGNOSIS — B369 Superficial mycosis, unspecified: Secondary | ICD-10-CM

## 2019-09-12 DIAGNOSIS — N898 Other specified noninflammatory disorders of vagina: Secondary | ICD-10-CM

## 2019-09-12 MED ORDER — FLUCONAZOLE 100 MG PO TABS: 100.0000 mg | ORAL_TABLET | Freq: Every day | ORAL | 1 refills | Status: DC

## 2019-09-12 NOTE — Progress Notes (Signed)
  Subjective:     Patient ID: Deborah Barajas, female   DOB: Jun 19, 1965, 54 y.o.   MRN: [id number]  HPI Deborah Barajas is a 54 year old white female,married, PM, back in follow on skin fungus and itching. The itching is better, but more red. PCP is Burman Freestone NP.   Review of Systems  Itching is better  More red, went to beach and got too much sun and and ate donuts Reviewed past medical,surgical, social and family history. Reviewed medications and allergies.     Objective:   Physical Exam BP 134/82 (BP Location: Left Arm, Patient Position: Sitting, Cuff Size: Normal)   Pulse 71   Ht 5\' 4"  (1.626 m)   Wt 254 lb (115.2 kg)   BMI 43.60 kg/m    Skin warm and dry.Pelvic: external genitalia is more red in groin and labia, still has some cracking in groin area, L>R, pained with gentian violet, vagina is painted with gentian violet.  Examination chaperoned by Rolena Infante   Assessment:     1. Infection of skin and subcutaneous tissue due to fungus Painted with gentian  violet Will rx diflucan Meds ordered this encounter  Medications  . fluconazole (DIFLUCAN) 100 MG tablet    Sig: Take 1 tablet (100 mg total) by mouth daily.    Dispense:  10 tablet    Refill:  1    Order Specific Question:   Supervising Provider    Answer:   Florian Buff [[id number]]  Pat dry Use nystatin powders   2. Vaginal itching, is better     Plan:     Follow up in 2 weeks

## 2019-09-25 ENCOUNTER — Telehealth: Payer: Self-pay | Admitting: Adult Health

## 2019-09-25 NOTE — Telephone Encounter (Signed)

## 2019-09-26 ENCOUNTER — Other Ambulatory Visit: Payer: Self-pay

## 2019-09-26 ENCOUNTER — Encounter: Payer: Self-pay | Admitting: Adult Health

## 2019-09-26 ENCOUNTER — Ambulatory Visit: Payer: 59 | Admitting: Adult Health

## 2019-09-26 VITALS — BP 134/80 | HR 67 | Ht 64.0 in | Wt 254.0 lb

## 2019-09-26 DIAGNOSIS — N920 Excessive and frequent menstruation with regular cycle: Secondary | ICD-10-CM

## 2019-09-26 DIAGNOSIS — B369 Superficial mycosis, unspecified: Secondary | ICD-10-CM

## 2019-09-26 DIAGNOSIS — Z780 Asymptomatic menopausal state: Secondary | ICD-10-CM | POA: Diagnosis not present

## 2019-09-26 MED ORDER — KETOCONAZOLE 2 % EX CREA: 1.0000 "application " | TOPICAL_CREAM | Freq: Two times a day (BID) | CUTANEOUS | 1 refills | Status: DC

## 2019-09-26 NOTE — Progress Notes (Signed)
  Subjective:     Patient ID: Deborah Barajas, female   DOB: [birth year], 54 y.o.   MRN: [id number]  HPI Deborah Barajas is a 54 year old white female,married, back in follow up of skin fungus and is still itching but it is better, thinks mytrex works better than nystatin powder. PCP is Burman Freestone NP  Review of Systems Still has itching Had ? Period in January and now feels like wants to start, had ablation 06/2018 Reviewed past medical,surgical, social and family history. Reviewed medications and allergies.     Objective:   Physical Exam BP 134/80 (BP Location: Left Arm, Patient Position: Sitting, Cuff Size: Normal)   Pulse 67   Ht 5\' 4"  (1.626 m)   Wt 254 lb (115.2 kg)   BMI 43.60 kg/m   Examination chaperoned by Celene Squibb LPN Skin warm and dry.Pelvic: external genitalia is less red, but has redness and cracks left groin.    Assessment:     1. Menopause Check FSH, if elevated, will get [place] to assess uterus   2. Spotting She had ablation 06/2018, will check [place] today, if elevated will get [place] to assess uterus    3. Infection of skin and subcutaneous tissue due to fungus Keep clean and dry  Will try ketoconazole Meds ordered this encounter  Medications  . ketoconazole (NIZORAL) 2 % cream    Sig: Apply 1 application topically 2 (two) times daily.    Dispense:  30 g    Refill:  1    Order Specific Question:   Supervising Provider    Answer:   Florian Buff [[id number]]      Plan:     Will recheck in 3 weeks

## 2019-09-27 LAB — FOLLICLE STIMULATING HORMONE: FSH: 3.2 m[IU]/mL

## 2019-09-30 ENCOUNTER — Telehealth: Payer: Self-pay | Admitting: Adult Health

## 2019-09-30 NOTE — Telephone Encounter (Signed)
Pt aware that [facility] is 3.2 so ovaries still working some,will not get [place] yet, keep follow up appt.

## 2019-10-16 ENCOUNTER — Telehealth: Payer: Self-pay | Admitting: Adult Health

## 2019-10-16 NOTE — Telephone Encounter (Signed)

## 2019-10-17 ENCOUNTER — Other Ambulatory Visit: Payer: Self-pay

## 2019-10-17 ENCOUNTER — Ambulatory Visit: Payer: 59 | Admitting: Adult Health

## 2019-10-17 ENCOUNTER — Encounter: Payer: Self-pay | Admitting: Adult Health

## 2019-10-17 VITALS — BP 134/88 | HR 88 | Ht 64.0 in | Wt 251.8 lb

## 2019-10-17 DIAGNOSIS — L900 Lichen sclerosus et atrophicus: Secondary | ICD-10-CM

## 2019-10-17 DIAGNOSIS — N898 Other specified noninflammatory disorders of vagina: Secondary | ICD-10-CM

## 2019-10-17 MED ORDER — CLOBETASOL PROPIONATE 0.05 % EX CREA: 1.0000 "application " | TOPICAL_CREAM | Freq: Two times a day (BID) | CUTANEOUS | 3 refills | Status: DC

## 2019-10-17 NOTE — Progress Notes (Signed)
  Subjective:     Patient ID: Deborah Barajas, female   DOB: 13-Jul-1965, 54 y.o.   MRN: [id number]  HPI Deborah Barajas is a 54 year old white female,married back in follow up on vaginal itching and is better. PCP Burman Freestone NP.  Review of Systems Still has some itching but much better Had spotting 5/6 and felt better lost 3-4 lbs of fluid she says, but had body aches lately  Reviewed past medical,surgical, social and family history. Reviewed medications and allergies.     Objective:   Physical Exam BP 134/88 (BP Location: Left Arm, Patient Position: Sitting, Cuff Size: Normal)   Pulse 88   Ht 5\' 4"  (1.626 m)   Wt 251 lb 12.8 oz (114.2 kg)   LMP 10/03/2019   BMI 43.22 kg/m  Skin warm and dry.Pelvic: external genitalia is normal in appearance, no redness, has area at base of labia that is white and thin with cracks, will treat with temovate, appears like lichen sclerosus.    Assessment:     1. Vaginal itching, it is better  2. Lichen sclerosus -will Rx temovate She is aware that this is chronic Meds ordered this encounter  Medications  . clobetasol cream (TEMOVATE) 0.05 %    Sig: Apply 1 application topically 2 (two) times daily.    Dispense:  30 g    Refill:  3    Order Specific Question:   Supervising Provider    Answer:   Florian Buff [[id number]]      Plan:     Follow up in 4 weeks

## 2019-11-13 ENCOUNTER — Telehealth: Payer: Self-pay | Admitting: Adult Health

## 2019-11-13 NOTE — Telephone Encounter (Signed)
Called patient regarding appointment and the following message was left:   Updated visitor policy: We are now allowing one support person with you during your upcoming visit.  However, we do ask that they wear a mask and will also be screened at check-in.   We ask if you are sick, have any symptoms of COVID, have had any exposure to anyone suspected or confirmed of having COVID-19, or are awaiting test results for COVID-19, to call our office as we may need to reschedule you for a virtual visit or schedule your appointment for a later date.    Please know we will ask you these questions or similar questions when you arrive for your appointment and understand this is how we are keeping everyone safe.    Also,to keep you safe, please use the provided hand sanitizer when you enter the office. We are asking everyone in the office to wear a mask to help prevent the spread of germs. If you have a mask of your own, please wear it to your appointment, if not, we are happy to provide one for you.  Thank you for understanding and your cooperation.    [facility] Staff

## 2019-11-14 ENCOUNTER — Ambulatory Visit (INDEPENDENT_AMBULATORY_CARE_PROVIDER_SITE_OTHER): Payer: 59 | Admitting: Adult Health

## 2019-11-14 ENCOUNTER — Encounter: Payer: Self-pay | Admitting: Adult Health

## 2019-11-14 VITALS — BP 118/74 | HR 69 | Ht 64.0 in | Wt 254.0 lb

## 2019-11-14 DIAGNOSIS — L900 Lichen sclerosus et atrophicus: Secondary | ICD-10-CM | POA: Diagnosis not present

## 2019-11-14 NOTE — Progress Notes (Addendum)
  Subjective:     Patient ID: Deborah Barajas, female   DOB: November 03, 1965, 54 y.o.   MRN: [id number]  HPI Deborah Barajas is a 54 year old white female, married, back in follow up on using temovate and feels much better, no itching. PCP is Burman Freestone NP.  Review of Systems Itching has resolved, feels much better Reviewed past medical,surgical, social and family history. Reviewed medications and allergies.     Objective:   Physical Exam BP 118/74 (BP Location: Left Arm, Patient Position: Sitting, Cuff Size: Large)   Pulse 69   Ht 5\' 4"  (1.626 m)   Wt 254 lb (115.2 kg)   BMI 43.60 kg/m  Skin warm and dry.Pelvic: external genitalia is normal in appearance no lesions, the area at base of labia is no longer white and has no cracking noted, still thin, has she says no itching Fall risk is low Examination chaperoned by Levy Pupa LPN    Assessment:     1. Lichen sclerosus Continue temovate 2-3 x weekly,has refills     Plan:     Follow up in 11 months for pap and physical

## 2019-11-22 ENCOUNTER — Other Ambulatory Visit: Payer: Self-pay | Admitting: Adult Health

## 2020-01-23 ENCOUNTER — Other Ambulatory Visit: Payer: Self-pay | Admitting: Adult Health

## 2020-02-11 ENCOUNTER — Other Ambulatory Visit: Payer: Self-pay | Admitting: Adult Health

## 2020-06-12 ENCOUNTER — Other Ambulatory Visit: Payer: Self-pay

## 2020-06-12 ENCOUNTER — Ambulatory Visit: Payer: 59 | Admitting: Adult Health

## 2020-06-12 ENCOUNTER — Encounter: Payer: Self-pay | Admitting: Adult Health

## 2020-06-12 VITALS — BP 149/86 | HR 74 | Ht 64.0 in | Wt 247.4 lb

## 2020-06-12 DIAGNOSIS — L02224 Furuncle of groin: Secondary | ICD-10-CM | POA: Diagnosis not present

## 2020-06-12 MED ORDER — DOXYCYCLINE HYCLATE 100 MG PO TABS: 100.0000 mg | ORAL_TABLET | Freq: Two times a day (BID) | ORAL | 0 refills | Status: DC

## 2020-06-12 MED ORDER — SILVER SULFADIAZINE 1 % EX CREA: 1.0000 "application " | TOPICAL_CREAM | Freq: Two times a day (BID) | CUTANEOUS | 0 refills | Status: DC

## 2020-06-12 NOTE — Progress Notes (Signed)
  Subjective:     Patient ID: Deborah Barajas, female   DOB: [birth year], 55 y.o.   MRN: [id number]  HPI Deborah Barajas is a 55 year old white female,married, in complaining of ?boils. PCP is Burman Freestone NP.  Review of Systems Has ?boils, has irritation base right labia and top of butt crack Reviewed past medical,surgical, social and family history. Reviewed medications and allergies.     Objective:   Physical Exam BP (!) 149/86 (BP Location: Right Arm, Patient Position: Sitting, Cuff Size: Large)   Pulse 74   Ht 5\' 4"  (1.626 m)   Wt 247 lb 6.4 oz (112.2 kg)   BMI 42.47 kg/m   Skin warm and dry.Pelvic: external genitalia is normal in appearance,has boil at base right labia, purple, tender, and area on left at gluteal cleft, that is slightly red. Examination chaperoned by Levy Pupa LPN Fall risk is low  [facility] - 06/12/20 1125      Pregnancy Intention Screening   Does the patient want to become pregnant in the next year? N/A    Does the patient's partner want to become pregnant in the next year? N/A    Would the patient like to discuss contraceptive options today? N/A      Contraception Wrap Up   Current Method No Method - Other Reason   ablation   End Method No Method - Other Reason   ablation   Contraception Counseling Provided No             Assessment:     1. Boil of groin Can apply warm compresses  No not squeeze Will rx doxycycline and silvadene Meds ordered this encounter  Medications  . doxycycline (VIBRA-TABS) 100 MG tablet    Sig: Take 1 tablet (100 mg total) by mouth 2 (two) times daily.    Dispense:  20 tablet    Refill:  0    Order Specific Question:   Supervising Provider    Answer:   Elonda Husky, LUTHER H [[id number]]  . silver sulfADIAZINE (SILVADENE) 1 % cream    Sig: Apply 1 application topically 2 (two) times daily.    Dispense:  50 g    Refill:  0    Order Specific Question:   Supervising Provider    Answer:   Florian Buff [[id number]]       Plan:      Return in 2 weeks for recheck

## 2020-06-12 NOTE — Patient Instructions (Signed)
Skin Abscess  A skin abscess is an infected area of your skin that contains pus and other material. An abscess can happen in any part of your body. Some abscesses break open (rupture) on their own. Most continue to get worse unless they are treated. The infection can spread deeper into the body and into your blood, which can make you feel sick. A skin abscess is caused by germs that enter the skin through a cut or scrape. It can also be caused by blocked oil and sweat glands or infected hair follicles. This condition is usually treated by:  Draining the pus.  Taking antibiotic medicines.  Placing a warm, wet washcloth over the abscess. Follow these instructions at home: Medicines  Take over-the-counter and prescription medicines only as told by your doctor.  If you were prescribed an antibiotic medicine, take it as told by your doctor. Do not stop taking the antibiotic even if you start to feel better.   Abscess care  If you have an abscess that has not drained, place a warm, clean, wet washcloth over the abscess several times a day. Do this as told by your doctor.  Follow instructions from your doctor about how to take care of your abscess. Make sure you: ? Cover the abscess with a bandage (dressing). ? Change your bandage or gauze as told by your doctor. ? Wash your hands with soap and water before you change the bandage or gauze. If you cannot use soap and water, use hand sanitizer.  Check your abscess every day for signs that the infection is getting worse. Check for: ? More redness, swelling, or pain. ? More fluid or blood. ? Warmth. ? More pus or a bad smell.   General instructions  To avoid spreading the infection: ? Do not share personal care items, towels, or hot tubs with others. ? Avoid making skin-to-skin contact with other people.  Keep all follow-up visits as told by your doctor. This is important. Contact a doctor if:  You have more redness, swelling, or pain  around your abscess.  You have more fluid or blood coming from your abscess.  Your abscess feels warm when you touch it.  You have more pus or a bad smell coming from your abscess.  You have a fever.  Your muscles ache.  You have chills.  You feel sick. Get help right away if:  You have very bad (severe) pain.  You see red streaks on your skin spreading away from the abscess. Summary  A skin abscess is an infected area of your skin that contains pus and other material.  The abscess is caused by germs that enter the skin through a cut or scrape. It can also be caused by blocked oil and sweat glands or infected hair follicles.  Follow your doctor's instructions on caring for your abscess, taking medicines, preventing infections, and keeping follow-up visits. This information is not intended to replace advice given to you by your health care provider. Make sure you discuss any questions you have with your health care provider. Document Revised: 12/20/2018 Document Reviewed: 06/29/2017 Elsevier Patient Education  2021 [employer].

## 2020-06-26 ENCOUNTER — Other Ambulatory Visit: Payer: Self-pay

## 2020-06-26 ENCOUNTER — Ambulatory Visit: Payer: 59 | Admitting: Adult Health

## 2020-06-26 ENCOUNTER — Encounter: Payer: Self-pay | Admitting: Adult Health

## 2020-06-26 VITALS — BP 138/87 | HR 81 | Ht 64.0 in | Wt 253.0 lb

## 2020-06-26 DIAGNOSIS — L02224 Furuncle of groin: Secondary | ICD-10-CM

## 2020-06-26 MED ORDER — AMOXICILLIN-POT CLAVULANATE 875-125 MG PO TABS: 1.0000 | ORAL_TABLET | Freq: Two times a day (BID) | ORAL | 0 refills | Status: DC

## 2020-06-26 NOTE — Progress Notes (Signed)
  Subjective:     Patient ID: Deborah Barajas, female   DOB: Feb 15, 1966, 55 y.o.   MRN: [id number]  HPI Deborah Barajas is a 55 year old white female,married, sp ablation, back in follow up on boil in right groin is better but wear jeans that rubbed.  PCP is Burman Freestone NP  Review of Systems Boil is better but still tender Reviewed past medical,surgical, social and family history. Reviewed medications and allergies.     Objective:   Physical Exam BP 138/87 (BP Location: Left Arm, Patient Position: Sitting, Cuff Size: Large)   Pulse 81   Ht 5\' 4"  (1.626 m)   Wt 253 lb (114.8 kg)   BMI 43.43 kg/m Skin is warm and dry, the area at base right labia is less purple and still tender at top part, Dr Elonda Husky for co exam and he wants her to take Augmentin for 10 days and see him in 2 weeks for possible excision.     Assessment:     1. Boil of groin Will rx Augmentin per Dr Elonda Husky, she has itching with PCN will take benadryl if needed  Meds ordered this encounter  Medications  . amoxicillin-clavulanate (AUGMENTIN) 875-125 MG tablet    Sig: Take 1 tablet by mouth 2 (two) times daily.    Dispense:  20 tablet    Refill:  0    Order Specific Question:   Supervising Provider    Answer:   Florian Buff [[id number]]      Plan:     Follow up with Dr Elonda Husky in 2 weeks for possible excision

## 2020-07-10 ENCOUNTER — Ambulatory Visit (INDEPENDENT_AMBULATORY_CARE_PROVIDER_SITE_OTHER): Payer: 59 | Admitting: Obstetrics & Gynecology

## 2020-07-10 ENCOUNTER — Other Ambulatory Visit: Payer: Self-pay | Admitting: Obstetrics & Gynecology

## 2020-07-10 ENCOUNTER — Other Ambulatory Visit: Payer: Self-pay

## 2020-07-10 ENCOUNTER — Encounter: Payer: Self-pay | Admitting: Obstetrics & Gynecology

## 2020-07-10 VITALS — BP 110/75 | HR 66 | Ht 64.0 in | Wt 248.0 lb

## 2020-07-10 DIAGNOSIS — N764 Abscess of vulva: Secondary | ICD-10-CM

## 2020-07-10 NOTE — Progress Notes (Signed)
Excision Procedure Note: Right vulvar lesion  Pre-operative Diagnosis: recurrent right vulvar abscess S/P antibiotics for 10 days preop augmentin  Post-operative Diagnosis: same  Locations:right vulva   Indications: recurrent for 10 months  Anesthesia: bupivicaine 0.5% 30 cc total   Procedure Details  History of allergy to iodine: no  Patient informed of the risks (including bleeding and infection) and benefits of the  procedure and Written informed consent obtained.  The lesion and surrounding area was given a sterile prep using betadyne and draped in the usual sterile fashion. An incision was made lateral to the abscess which was dissected free of the surrounding tissue and removed.  .  The wound was closed with 3-0 ethilon  using simple interrupted stitches. Antibiotic ointment and a peri pad was applied.  The specimen was sent for pathologic examination. The patient tolerated the procedure well.  EBL: 25 ml  Findings: Right multilocular vulvar abscess  Condition: Stable ethilon Complications: none.  Plan: 1. Instructed to keep the wound dry and covered for 24-48h and clean thereafter. 2. Warning signs of infection were reviewed.   3. Recommended that the patient use OTC ibuprofen as needed for pain.  4. Return for suture removal in 1 week.

## 2020-07-17 ENCOUNTER — Other Ambulatory Visit: Payer: Self-pay

## 2020-07-17 ENCOUNTER — Ambulatory Visit: Payer: 59 | Admitting: Obstetrics & Gynecology

## 2020-07-17 ENCOUNTER — Encounter: Payer: Self-pay | Admitting: Obstetrics & Gynecology

## 2020-07-17 VITALS — BP 123/82 | HR 68

## 2020-07-17 DIAGNOSIS — Z4802 Encounter for removal of sutures: Secondary | ICD-10-CM

## 2020-07-17 NOTE — Progress Notes (Signed)
  HPI: Patient returns for routine postoperative follow-up having undergone abscess on 07/10/20.  She reports doing well today, no acute complaints   Current Outpatient Medications: atorvastatin (LIPITOR) 40 MG tablet, Take 40 mg by mouth daily., Disp: , Rfl:  butalbital-acetaminophen-caffeine (FIORICET, ESGIC) 50-325-40 MG tablet, TAKE 1 TABLET BY MOUTH EVERY 6 HOURS AS NEEDED FOR HEADACHE (Patient taking differently: Take 1 tablet by mouth every 6 (six) hours as needed for headache.), Disp: 30 tablet, Rfl: 1 clobetasol cream (TEMOVATE) 8.84 %, Apply 1 application topically 2 (two) times daily., Disp: 30 g, Rfl: 3 enalapril (VASOTEC) 5 MG tablet, Take 1 tablet (5 mg total) by mouth daily., Disp: 30 tablet, Rfl: 11 gabapentin (NEURONTIN) 300 MG capsule, Take 300 mg by mouth at bedtime as needed (pain). , Disp: , Rfl: 3 hydrochlorothiazide (HYDRODIURIL) 25 MG tablet, Take 1 tablet (25 mg total) by mouth daily., Disp: 30 tablet, Rfl: 11 loratadine (CLARITIN) 10 MG tablet, Take 10 mg by mouth daily as needed for allergies., Disp: , Rfl:  metFORMIN (GLUCOPHAGE) 500 MG tablet, Take 500 mg by mouth 2 (two) times daily with a meal. , Disp: , Rfl: 3 nystatin (NYSTATIN) powder, USE TWICE DAILY TO AFFECTED AREAS., Disp: 15 g, Rfl: 3 nystatin-triamcinolone ointment (MYCOLOG), Apply 1 application topically 2 (two) times daily. (Patient taking differently: Apply 1 application topically daily as needed (irritation).), Disp: 30 g, Rfl: 2 silver sulfADIAZINE (SILVADENE) 1 % cream, Apply 1 application topically 2 (two) times daily., Disp: 50 g, Rfl: 0 amoxicillin-clavulanate (AUGMENTIN) 875-125 MG tablet, Take 1 tablet by mouth 2 (two) times daily., Disp: 20 tablet, Rfl: 0  No current facility-administered medications for this visit.    Blood pressure 123/82, pulse 68.  Physical Exam: Tissue well healed, minimal bruising No signs/symptoms of infection  Diagnostic Tests:  n/a  Pathology: benign  Impression: 1. Visit for suture removal      Plan: Healing appropriately  Follow up: as needed   Florian Buff, MD

## 2020-09-18 ENCOUNTER — Other Ambulatory Visit: Payer: Self-pay | Admitting: Family Medicine

## 2020-09-18 DIAGNOSIS — Z1231 Encounter for screening mammogram for malignant neoplasm of breast: Secondary | ICD-10-CM

## 2021-03-24 ENCOUNTER — Encounter: Payer: Self-pay | Admitting: Adult Health

## 2021-03-24 ENCOUNTER — Other Ambulatory Visit: Payer: Self-pay

## 2021-03-24 ENCOUNTER — Ambulatory Visit (INDEPENDENT_AMBULATORY_CARE_PROVIDER_SITE_OTHER): Payer: 59 | Admitting: Adult Health

## 2021-03-24 ENCOUNTER — Other Ambulatory Visit (HOSPITAL_COMMUNITY)
Admission: RE | Admit: 2021-03-24 | Discharge: 2021-03-24 | Disposition: A | Discharge status: Home / Self Care | Payer: 59 | Source: Ambulatory Visit | Attending: Adult Health | Admitting: Adult Health

## 2021-03-24 VITALS — BP 124/80 | HR 70 | Ht 64.25 in | Wt 256.0 lb

## 2021-03-24 DIAGNOSIS — Z1231 Encounter for screening mammogram for malignant neoplasm of breast: Secondary | ICD-10-CM | POA: Insufficient documentation

## 2021-03-24 DIAGNOSIS — L0292 Furuncle, unspecified: Secondary | ICD-10-CM | POA: Insufficient documentation

## 2021-03-24 DIAGNOSIS — Z1211 Encounter for screening for malignant neoplasm of colon: Secondary | ICD-10-CM | POA: Diagnosis not present

## 2021-03-24 DIAGNOSIS — Z01419 Encounter for gynecological examination (general) (routine) without abnormal findings: Secondary | ICD-10-CM | POA: Insufficient documentation

## 2021-03-24 DIAGNOSIS — B369 Superficial mycosis, unspecified: Secondary | ICD-10-CM | POA: Diagnosis not present

## 2021-03-24 DIAGNOSIS — Z780 Asymptomatic menopausal state: Secondary | ICD-10-CM

## 2021-03-24 DIAGNOSIS — Z853 Personal history of malignant neoplasm of breast: Secondary | ICD-10-CM

## 2021-03-24 LAB — HEMOCCULT GUIAC POC 1CARD (OFFICE): Fecal Occult Blood, POC: NEGATIVE

## 2021-03-24 MED ORDER — FLUCONAZOLE 100 MG PO TABS: ORAL_TABLET | ORAL | 0 refills | Status: AC

## 2021-03-24 MED ORDER — AMOXICILLIN-POT CLAVULANATE 875-125 MG PO TABS: 1.0000 | ORAL_TABLET | Freq: Two times a day (BID) | ORAL | 0 refills | Status: DC

## 2021-03-24 NOTE — Progress Notes (Signed)
Patient ID: Deborah Barajas, female   DOB: 1966-03-28, 55 y.o.   MRN: [id number] History of Present Illness: Deborah Barajas is a 55 year old white female,married, PM in for well woman gyn exam and pap. She is having skin fungus in groin area, has body aches and moody and has hot flashes. She has had breast cancer and past due on mammogram wants to start getting at [facility]. PCP is [facility].  Current Medications, Allergies, Past Medical History, Past Surgical History, Family History and Social History were reviewed in [employer] record.     Review of Systems: Patient denies any headaches, hearing loss, fatigue, blurred vision, shortness of breath, chest pain, abdominal pain, problems with bowel movements, urination, or intercourse.(Not active). No joint pain. See HPI for positives.     Physical Exam:BP 124/80 (BP Location: Left Arm, Patient Position: Sitting, Cuff Size: Large)   Pulse 70   Ht 5' 4.25" (1.632 m)   Wt 256 lb (116.1 kg)   BMI 43.60 kg/m   General:  Well developed, well nourished, no acute distress Skin:  Warm and dry Neck:  Midline trachea, normal thyroid, good ROM, no lymphadenopathy Lungs; Clear to auscultation bilaterally Breast:  No dominant palpable mass, retraction, or nipple discharge,has scar left breast from lumpectomy. Cardiovascular: Regular rate and rhythm Abdomen:  Soft, non tender, no hepatosplenomegaly,obese Pelvic:  External genitalia is normal in appearance, except is red in groin and has healing boil right panniculus, painted with gentian violet.  The vagina is pale with loss of moisture and rugae. Urethra has no lesions or masses. The cervix is smooth, pap with HR HPV genotyping.  Uterus is felt to be normal size, shape, and contour.  No adnexal masses or tenderness noted.Bladder is non tender, no masses felt. Rectal: Good sphincter tone, no polyps, or hemorrhoids felt.  Hemoccult negative. Extremities/musculoskeletal:  No swelling or  varicosities noted, no clubbing or cyanosis,has some point tenderness over shins and sternum  Psych:  No mood changes, alert and cooperative,seems happy AA is 1 Fall risk is low Depression screen [facility] 2/9 03/24/2021 10/09/2017 10/09/2017  Decreased Interest 1 3 3   Down, Depressed, Hopeless 1 3 3   PHQ - 2 Score 2 6 6   Altered sleeping 3 3 -  Tired, decreased energy 3 3 -  Change in appetite 2 1 -  Feeling bad or failure about yourself  2 3 -  Trouble concentrating 1 3 -  Moving slowly or fidgety/restless 0 0 -  Suicidal thoughts 0 0 -  PHQ-9 Score 13 19 -  Difficult doing work/chores - Very difficult -   On celexa GAD 7 : Generalized Anxiety Score 03/24/2021  Nervous, Anxious, on Edge 1  Control/stop worrying 2  Worry too much - different things 2  Trouble relaxing 2  Restless 0  Easily annoyed or irritable 2  Afraid - awful might happen 2  Total GAD 7 Score 11      [facility] - 03/24/21 0944       Pregnancy Intention Screening   Does the patient want to become pregnant in the next year? No    Does the patient's partner want to become pregnant in the next year? No    Would the patient like to discuss contraceptive options today? No      Contraception Wrap Up   Current Method No Method - Other Reason   PM   End Method No Method - Other Reason   PM   Contraception Counseling Provided  No            Examination chaperoned by Celene Squibb LPN   Impression and Plan: 1. Encounter for gynecological examination with Papanicolaou smear of cervix Pap sent Physical in 1 year Pap in 3 if normal Labs with PCP Colonoscopy per GI  - Cytology - PAP( [place])  2. Encounter for screening fecal occult blood testing   3. Infection of skin and subcutaneous tissue due to fungus Painted with gentian violet Will rx diflucan   4. Boil Rx Augmentin Meds ordered this encounter  Medications   amoxicillin-clavulanate (AUGMENTIN) 875-125 MG tablet    Sig: Take 1 tablet by  mouth 2 (two) times daily.    Dispense:  20 tablet    Refill:  0    Order Specific Question:   Supervising Provider    Answer:   Elonda Husky, LUTHER H [[id number]]   fluconazole (DIFLUCAN) 100 MG tablet    Sig: take 1 daily for 10 days    Dispense:  10 tablet    Refill:  0    Order Specific Question:   Supervising Provider    Answer:   Tania Ade H [[id number]]   Follow up in 2 weeks with me   5. History of breast cancer  - MM 3D SCREEN BREAST BILATERAL; Future  6. Screening mammogram for breast cancer Mammogram scheduled for 04/05/21 at 7:45 am at [place]; Future  7. Postmenopause Consider massage for body aches,she said PCP gave injection that helped  Celexa should help with hot flashes

## 2021-03-26 LAB — CYTOLOGY - PAP
Comment: NEGATIVE
Diagnosis: NEGATIVE
High risk HPV: NEGATIVE

## 2021-04-05 ENCOUNTER — Other Ambulatory Visit: Payer: Self-pay

## 2021-04-05 ENCOUNTER — Ambulatory Visit (HOSPITAL_COMMUNITY)
Admission: RE | Admit: 2021-04-05 | Discharge: 2021-04-05 | Disposition: A | Discharge status: Home / Self Care | Payer: 59 | Source: Ambulatory Visit | Attending: Adult Health | Admitting: Adult Health

## 2021-04-05 DIAGNOSIS — Z853 Personal history of malignant neoplasm of breast: Secondary | ICD-10-CM | POA: Insufficient documentation

## 2021-04-05 DIAGNOSIS — Z1231 Encounter for screening mammogram for malignant neoplasm of breast: Secondary | ICD-10-CM | POA: Diagnosis present

## 2021-04-07 ENCOUNTER — Ambulatory Visit: Payer: 59 | Admitting: Adult Health

## 2021-04-20 ENCOUNTER — Encounter: Payer: Self-pay | Admitting: Adult Health

## 2021-04-20 ENCOUNTER — Ambulatory Visit: Payer: 59 | Admitting: Adult Health

## 2021-04-20 ENCOUNTER — Other Ambulatory Visit: Payer: Self-pay

## 2021-04-20 VITALS — BP 126/78 | HR 75 | Ht 64.5 in | Wt 253.6 lb

## 2021-04-20 DIAGNOSIS — B369 Superficial mycosis, unspecified: Secondary | ICD-10-CM

## 2021-04-20 DIAGNOSIS — L0292 Furuncle, unspecified: Secondary | ICD-10-CM

## 2021-04-20 NOTE — Progress Notes (Signed)
  Subjective:     Patient ID: Deborah Barajas, female   DOB: 05-20-1966, 55 y.o.   MRN: [id number]  HPI Deborah Barajas is a 55 year old white female, married, PM back in follow up on boil which is gone and skin fungus which is better. PCP is Burman Freestone FNP Lab Results  Component Value Date   DIAGPAP  03/24/2021    - Negative for intraepithelial lesion or malignancy (NILM)   HPV NOT DETECTED 10/09/2017   [place] Negative 03/24/2021    Review of Systems Boil is gone Skin fungus better Reviewed past medical,surgical, social and family history. Reviewed medications and allergies.     Objective:   Physical Exam BP 126/78 (BP Location: Right Arm, Patient Position: Sitting, Cuff Size: Large)   Pulse 75   Ht 5' 4.5" (1.638 m)   Wt 253 lb 9.6 oz (115 kg)   BMI 42.86 kg/m     Skin warm and dry.Pelvic: external genitalia is normal in appearance no lesions, boil has resolved, slightly red in creases of leg, painted with gentian violet Co exam with August Luz NP student.   [facility] - 04/20/21 0901       Pregnancy Intention Screening   Does the patient want to become pregnant in the next year? N/A    Does the patient's partner want to become pregnant in the next year? N/A    Would the patient like to discuss contraceptive options today? N/A      Contraception Wrap Up   Current Method --   PM   End Method --   PM   Contraception Counseling Provided No             Assessment:     1. Infection of skin and subcutaneous tissue due to fungus,much better  Painted with gentian violet   2. Boil,resolved    Plan:     Follow up prn She requests refill on cream but not sure of name, maybe mycolog, will call me with name

## 2021-04-21 ENCOUNTER — Telehealth: Payer: Self-pay | Admitting: Adult Health

## 2021-04-21 MED ORDER — TRIAMCINOLONE ACETONIDE 0.5 % EX OINT: 1.0000 "application " | TOPICAL_OINTMENT | Freq: Two times a day (BID) | CUTANEOUS | 2 refills | Status: AC

## 2021-04-21 NOTE — Telephone Encounter (Signed)
Patient calling stating that the name of the medicine is triamcinolone cream

## 2021-04-21 NOTE — Telephone Encounter (Signed)
Left message that triamcinolone ointment rx sent to [facility]

## 2021-04-21 NOTE — Addendum Note (Signed)
Addended by: Derrek Monaco A on: 04/21/2021 11:03 AM   Modules accepted: Orders

## 2021-06-30 ENCOUNTER — Telehealth: Payer: Self-pay | Admitting: Adult Health

## 2021-06-30 MED ORDER — CLOBETASOL PROPIONATE 0.05 % EX CREA: TOPICAL_CREAM | CUTANEOUS | 3 refills | Status: AC

## 2021-06-30 NOTE — Telephone Encounter (Signed)
Refilled temovate  

## 2021-06-30 NOTE — Telephone Encounter (Signed)
Pt is requesting Clobetasol cream. Thanks!! [place]

## 2021-06-30 NOTE — Telephone Encounter (Signed)
Patient called stating that she would like for [facility] to call her in Clobetasol into her pharmacy. Please contact pt when sent.

## 2021-06-30 NOTE — Addendum Note (Signed)
Addended by: Derrek Monaco A on: 06/30/2021 05:42 PM   Modules accepted: Orders

## 2021-07-01 ENCOUNTER — Telehealth: Payer: Self-pay | Admitting: Adult Health

## 2021-07-01 NOTE — Telephone Encounter (Signed)
I called pharmacy and gave clarification they needed. Pt aware. [place]

## 2021-07-01 NOTE — Telephone Encounter (Signed)
Patient called saying that she called pharmacy about her medicine and they said they had sent something needing clarification and was waiting on that. Please advise.

## 2022-10-31 IMAGING — MG MM DIGITAL SCREENING BILAT W/ TOMO AND CAD
8 series · 8 of 24 positions shown · non-contrast
Comparison: Previous exam(s).

CLINICAL DATA: Screening.

EXAM:
DIGITAL SCREENING BILATERAL MAMMOGRAM WITH TOMOSYNTHESIS AND CAD
TECHNIQUE: Bilateral screening digital craniocaudal and mediolateral oblique
mammograms were obtained. Bilateral screening digital breast
tomosynthesis was performed. The images were evaluated with
computer-aided detection.

[R CC synth-2D]
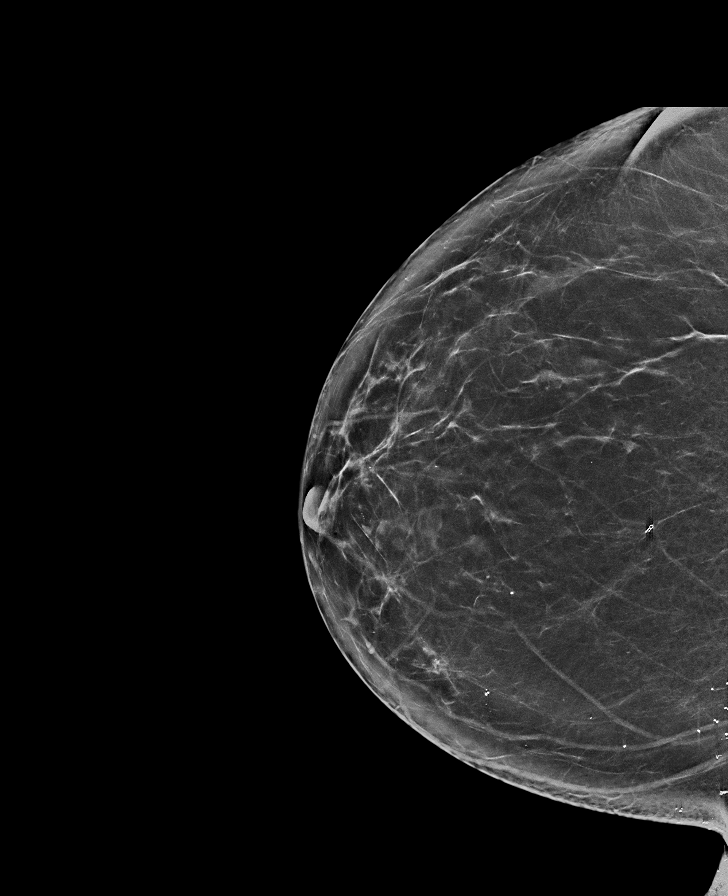

[R MLO synth-2D]
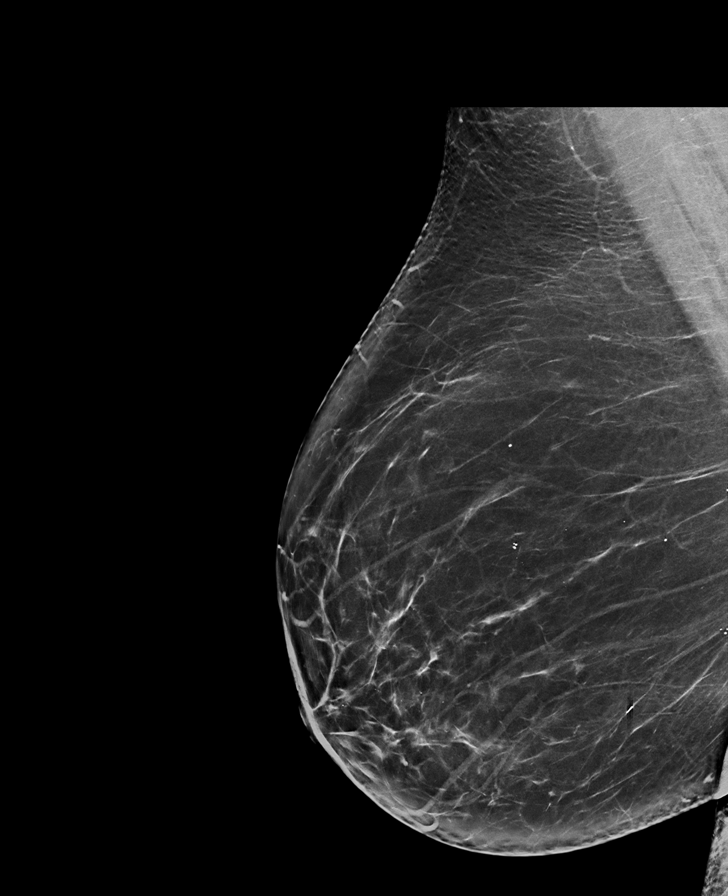

[L CC synth-2D]
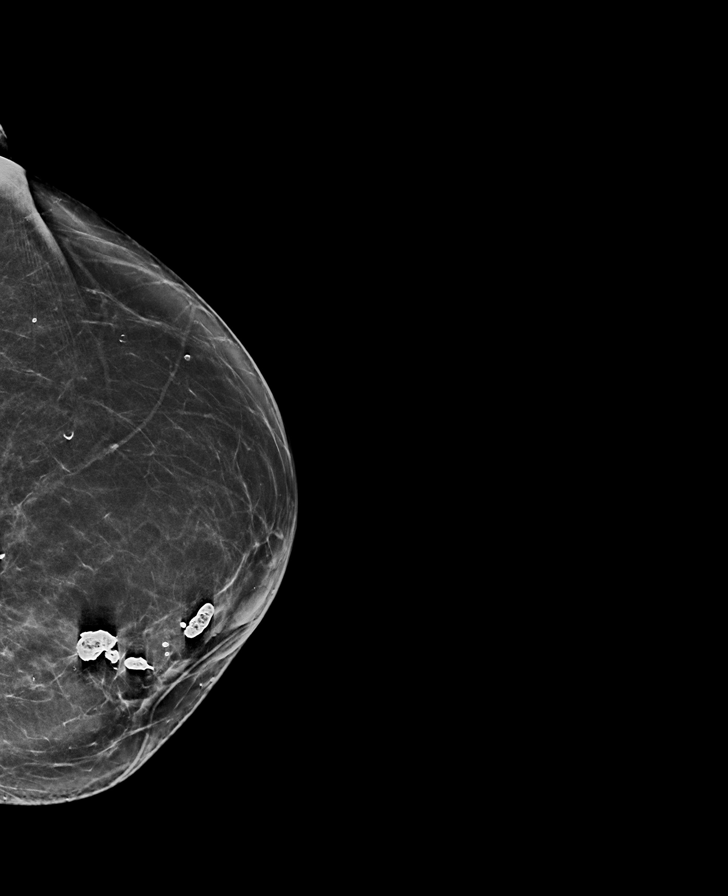

[L MLO synth-2D]
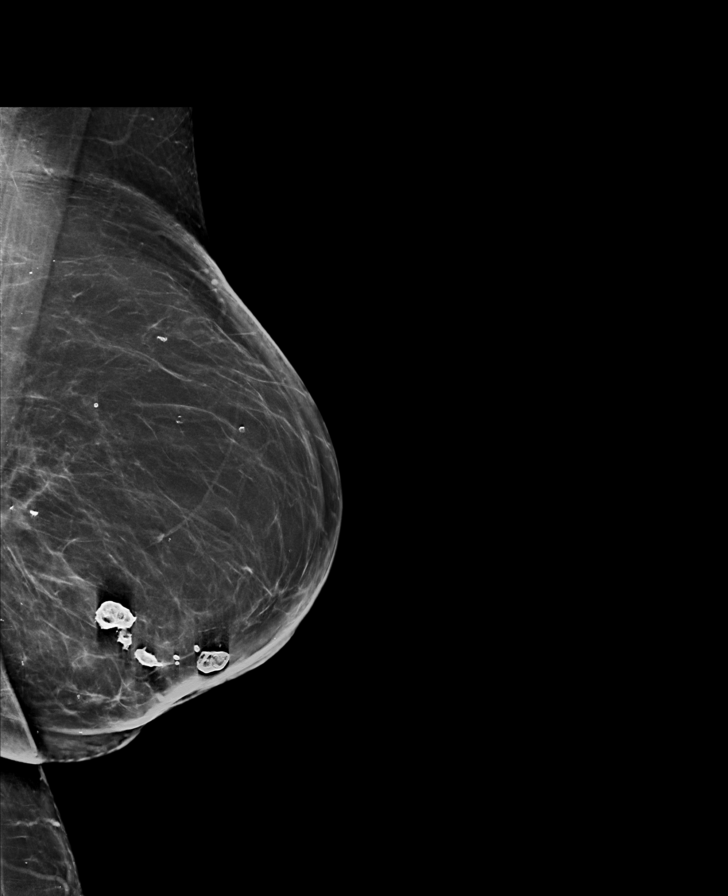

[L CC tomo · tomo slice 35/68.0]
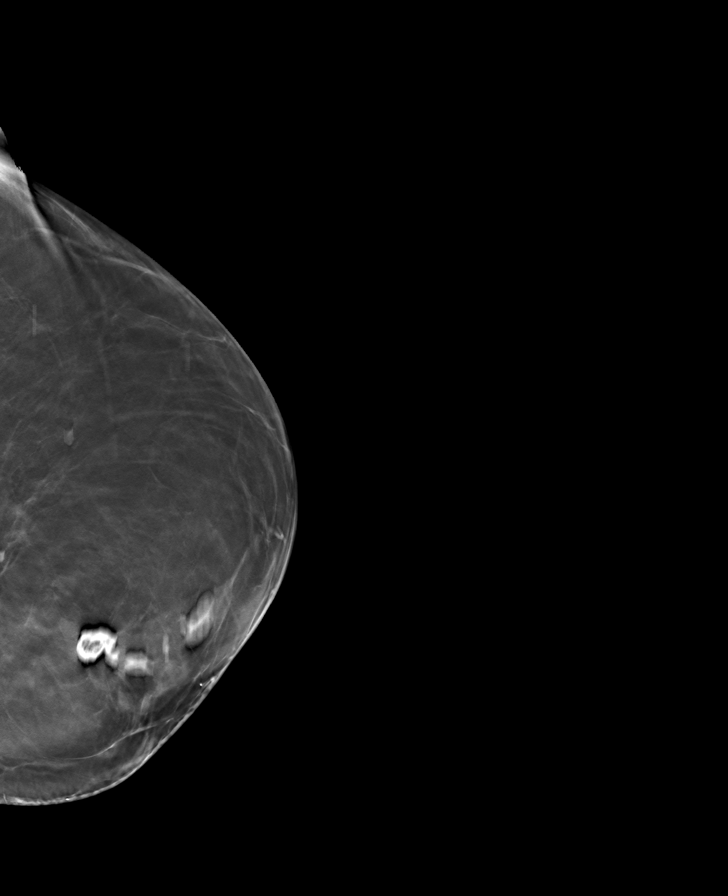

[L MLO tomo · tomo slice 38/75.0]
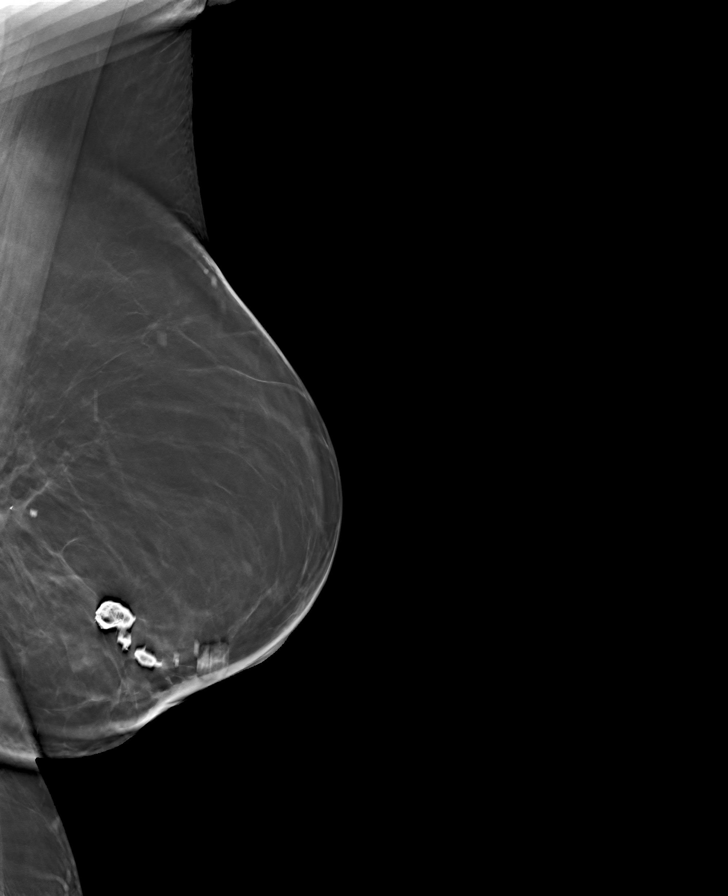

[R CC tomo · tomo slice 39/78.0]
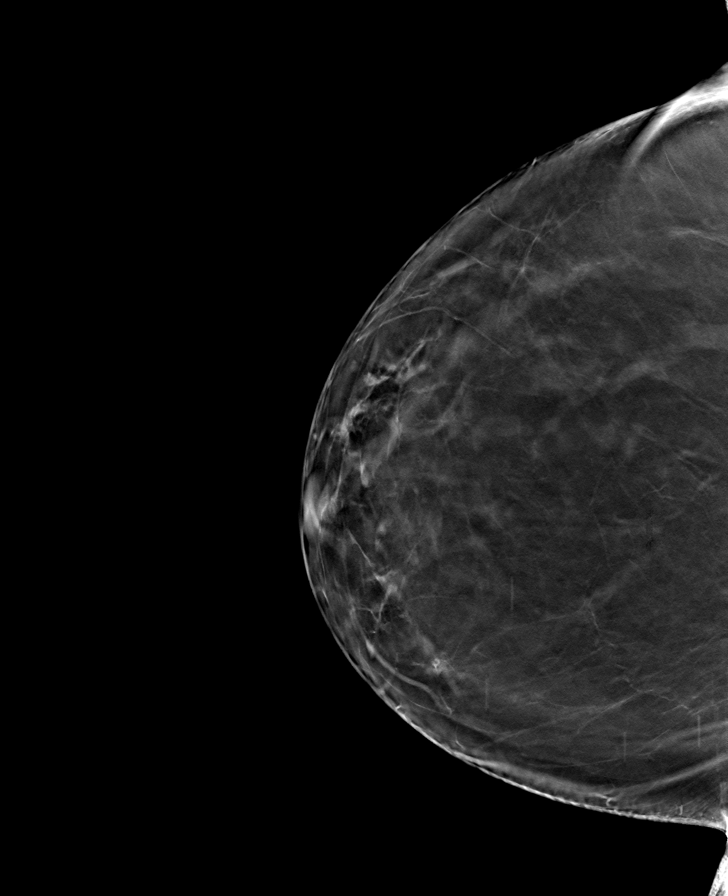

[R MLO tomo · tomo slice 43/84.0]
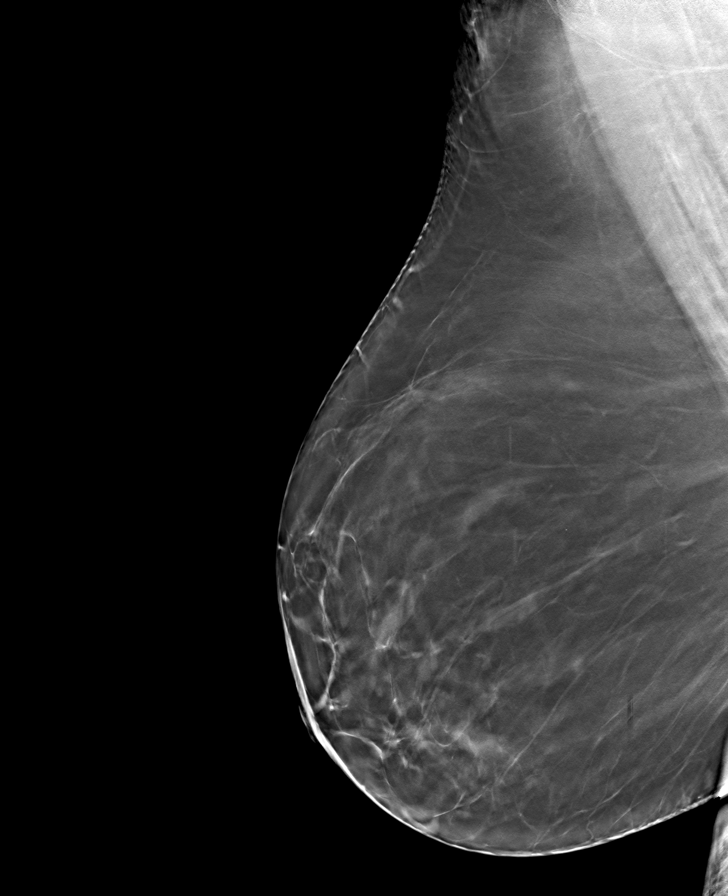

[8 of 24 positions shown; findings below may reference images not displayed]

ACR Breast Density Category b: There are scattered areas of
fibroglandular density.
FINDINGS: There are no findings suspicious for malignancy.
IMPRESSION: No mammographic evidence of malignancy. A result letter of this
screening mammogram will be mailed directly to the patient.

RECOMMENDATION:
Screening mammogram in one year. (Code:51-O-LD2)

BI-RADS CATEGORY  1: Negative.

## 2023-04-19 ENCOUNTER — Other Ambulatory Visit (HOSPITAL_COMMUNITY): Payer: Self-pay | Admitting: Family Medicine

## 2023-04-19 DIAGNOSIS — Z1231 Encounter for screening mammogram for malignant neoplasm of breast: Secondary | ICD-10-CM

## 2023-04-24 ENCOUNTER — Ambulatory Visit (HOSPITAL_COMMUNITY): Payer: 59
